# Patient Record
Sex: Male | Born: 1960 | Race: White | Hispanic: No | Marital: Married | Smoking: Current every day smoker
Health system: Southern US, Community
[De-identification: ages and names within clinical notes are randomized; demographics above are authoritative.]

## PROBLEM LIST (undated history)

## (undated) DIAGNOSIS — T300 Burn of unspecified body region, unspecified degree: Secondary | ICD-10-CM

## (undated) DIAGNOSIS — J189 Pneumonia, unspecified organism: Secondary | ICD-10-CM

## (undated) DIAGNOSIS — Z8719 Personal history of other diseases of the digestive system: Secondary | ICD-10-CM

## (undated) DIAGNOSIS — H9190 Unspecified hearing loss, unspecified ear: Secondary | ICD-10-CM

## (undated) DIAGNOSIS — J45909 Unspecified asthma, uncomplicated: Secondary | ICD-10-CM

## (undated) DIAGNOSIS — S83249A Other tear of medial meniscus, current injury, unspecified knee, initial encounter: Secondary | ICD-10-CM

## (undated) DIAGNOSIS — M1990 Unspecified osteoarthritis, unspecified site: Secondary | ICD-10-CM

## (undated) DIAGNOSIS — G709 Myoneural disorder, unspecified: Secondary | ICD-10-CM

## (undated) DIAGNOSIS — S83289A Other tear of lateral meniscus, current injury, unspecified knee, initial encounter: Secondary | ICD-10-CM

## (undated) HISTORY — PX: JOINT REPLACEMENT: SHX530

## (undated) HISTORY — PX: OTHER SURGICAL HISTORY: SHX169

## (undated) HISTORY — PX: TONSILLECTOMY: SUR1361

## (undated) HISTORY — PX: SCROTAL SURGERY: SHX2387

---

## 1999-03-23 ENCOUNTER — Emergency Department (HOSPITAL_COMMUNITY): Admission: EM | Admit: 1999-03-23 | Discharge: 1999-03-23 | Payer: Self-pay | Admitting: Emergency Medicine

## 1999-03-23 ENCOUNTER — Encounter: Payer: Self-pay | Admitting: Emergency Medicine

## 2009-03-07 DIAGNOSIS — J189 Pneumonia, unspecified organism: Secondary | ICD-10-CM

## 2009-03-07 HISTORY — DX: Pneumonia, unspecified organism: J18.9

## 2012-01-17 ENCOUNTER — Other Ambulatory Visit: Payer: Self-pay | Admitting: Occupational Medicine

## 2012-01-17 ENCOUNTER — Ambulatory Visit: Payer: Self-pay

## 2012-01-17 DIAGNOSIS — M25561 Pain in right knee: Secondary | ICD-10-CM

## 2012-03-07 DIAGNOSIS — S83249A Other tear of medial meniscus, current injury, unspecified knee, initial encounter: Secondary | ICD-10-CM

## 2012-03-07 DIAGNOSIS — S83289A Other tear of lateral meniscus, current injury, unspecified knee, initial encounter: Secondary | ICD-10-CM

## 2012-03-07 HISTORY — DX: Other tear of medial meniscus, current injury, unspecified knee, initial encounter: S83.249A

## 2012-03-07 HISTORY — DX: Other tear of lateral meniscus, current injury, unspecified knee, initial encounter: S83.289A

## 2012-03-27 ENCOUNTER — Encounter (HOSPITAL_BASED_OUTPATIENT_CLINIC_OR_DEPARTMENT_OTHER): Payer: Self-pay | Admitting: *Deleted

## 2012-03-27 ENCOUNTER — Other Ambulatory Visit: Payer: Self-pay | Admitting: Orthopedic Surgery

## 2012-03-27 DIAGNOSIS — T300 Burn of unspecified body region, unspecified degree: Secondary | ICD-10-CM

## 2012-03-27 HISTORY — DX: Burn of unspecified body region, unspecified degree: T30.0

## 2012-03-30 ENCOUNTER — Encounter (HOSPITAL_BASED_OUTPATIENT_CLINIC_OR_DEPARTMENT_OTHER)
Admission: RE | Disposition: A | Discharge status: Home / Self Care | Payer: Self-pay | Source: Ambulatory Visit | Attending: Orthopedic Surgery

## 2012-03-30 ENCOUNTER — Ambulatory Visit (HOSPITAL_BASED_OUTPATIENT_CLINIC_OR_DEPARTMENT_OTHER): Payer: Worker's Compensation | Admitting: *Deleted

## 2012-03-30 ENCOUNTER — Encounter (HOSPITAL_BASED_OUTPATIENT_CLINIC_OR_DEPARTMENT_OTHER): Payer: Self-pay | Admitting: *Deleted

## 2012-03-30 ENCOUNTER — Ambulatory Visit (HOSPITAL_BASED_OUTPATIENT_CLINIC_OR_DEPARTMENT_OTHER)
Admission: RE | Admit: 2012-03-30 | Discharge: 2012-03-30 | Disposition: A | Discharge status: Home / Self Care | Payer: Worker's Compensation | Source: Ambulatory Visit | Attending: Orthopedic Surgery | Admitting: Orthopedic Surgery

## 2012-03-30 DIAGNOSIS — M23329 Other meniscus derangements, posterior horn of medial meniscus, unspecified knee: Secondary | ICD-10-CM | POA: Insufficient documentation

## 2012-03-30 DIAGNOSIS — F17200 Nicotine dependence, unspecified, uncomplicated: Secondary | ICD-10-CM | POA: Insufficient documentation

## 2012-03-30 DIAGNOSIS — M23359 Other meniscus derangements, posterior horn of lateral meniscus, unspecified knee: Secondary | ICD-10-CM | POA: Insufficient documentation

## 2012-03-30 DIAGNOSIS — M2240 Chondromalacia patellae, unspecified knee: Secondary | ICD-10-CM | POA: Insufficient documentation

## 2012-03-30 HISTORY — DX: Unspecified hearing loss, unspecified ear: H91.90

## 2012-03-30 HISTORY — DX: Unspecified asthma, uncomplicated: J45.909

## 2012-03-30 HISTORY — PX: KNEE ARTHROSCOPY: SHX127

## 2012-03-30 HISTORY — DX: Other tear of lateral meniscus, current injury, unspecified knee, initial encounter: S83.289A

## 2012-03-30 HISTORY — DX: Other tear of medial meniscus, current injury, unspecified knee, initial encounter: S83.249A

## 2012-03-30 HISTORY — DX: Burn of unspecified body region, unspecified degree: T30.0

## 2012-03-30 SURGERY — ARTHROSCOPY, KNEE
Anesthesia: General | Site: Knee | Laterality: Right | Wound class: Clean

## 2012-03-30 MED ORDER — FENTANYL CITRATE 0.05 MG/ML IJ SOLN
INTRAMUSCULAR | Status: DC | PRN
  Administered 2012-03-30: 50 ug via INTRAVENOUS
  Administered 2012-03-30: 25 ug via INTRAVENOUS
  Administered 2012-03-30: 100 ug via INTRAVENOUS

## 2012-03-30 MED ORDER — DEXAMETHASONE SODIUM PHOSPHATE 4 MG/ML IJ SOLN
INTRAMUSCULAR | Status: DC | PRN
  Administered 2012-03-30: 10 mg via INTRAVENOUS

## 2012-03-30 MED ORDER — BUPIVACAINE HCL (PF) 0.5 % IJ SOLN
INTRAMUSCULAR | Status: DC | PRN
  Administered 2012-03-30: 20 mL

## 2012-03-30 MED ORDER — MIDAZOLAM HCL 2 MG/ML PO SYRP: 12.0000 mg | ORAL_SOLUTION | Freq: Once | ORAL | Status: DC | PRN

## 2012-03-30 MED ORDER — ONDANSETRON HCL 4 MG/2ML IJ SOLN: 4.0000 mg | Freq: Once | INTRAMUSCULAR | Status: DC | PRN

## 2012-03-30 MED ORDER — FENTANYL CITRATE 0.05 MG/ML IJ SOLN: 50.0000 ug | INTRAMUSCULAR | Status: DC | PRN

## 2012-03-30 MED ORDER — OXYCODONE-ACETAMINOPHEN 5-325 MG PO TABS: 1.0000 | ORAL_TABLET | Freq: Four times a day (QID) | ORAL | Status: DC | PRN

## 2012-03-30 MED ORDER — SODIUM CHLORIDE 0.9 % IR SOLN
Status: DC | PRN
  Administered 2012-03-30: 6000 mL

## 2012-03-30 MED ORDER — HYDROMORPHONE HCL PF 1 MG/ML IJ SOLN
0.2500 mg | INTRAMUSCULAR | Status: DC | PRN
  Administered 2012-03-30: 0.5 mg via INTRAVENOUS

## 2012-03-30 MED ORDER — PROPOFOL 10 MG/ML IV BOLUS
INTRAVENOUS | Status: DC | PRN
  Administered 2012-03-30: 300 mg via INTRAVENOUS

## 2012-03-30 MED ORDER — LIDOCAINE HCL (CARDIAC) 20 MG/ML IV SOLN
INTRAVENOUS | Status: DC | PRN
  Administered 2012-03-30: 75 mg via INTRAVENOUS

## 2012-03-30 MED ORDER — OXYCODONE HCL 5 MG PO TABS: 5.0000 mg | ORAL_TABLET | Freq: Once | ORAL | Status: DC | PRN

## 2012-03-30 MED ORDER — LACTATED RINGERS IV SOLN
INTRAVENOUS | Status: DC
  Administered 2012-03-30 (×2): via INTRAVENOUS

## 2012-03-30 MED ORDER — ONDANSETRON HCL 4 MG/2ML IJ SOLN
INTRAMUSCULAR | Status: DC | PRN
  Administered 2012-03-30: 4 mg via INTRAVENOUS

## 2012-03-30 MED ORDER — CEFAZOLIN SODIUM-DEXTROSE 2-3 GM-% IV SOLR
2.0000 g | INTRAVENOUS | Status: AC
  Administered 2012-03-30: 2 g via INTRAVENOUS

## 2012-03-30 MED ORDER — POVIDONE-IODINE 7.5 % EX SOLN: Freq: Once | CUTANEOUS | Status: DC

## 2012-03-30 MED ORDER — MIDAZOLAM HCL 2 MG/2ML IJ SOLN: 1.0000 mg | INTRAMUSCULAR | Status: DC | PRN

## 2012-03-30 MED ORDER — MIDAZOLAM HCL 5 MG/5ML IJ SOLN
INTRAMUSCULAR | Status: DC | PRN
  Administered 2012-03-30: 2 mg via INTRAVENOUS

## 2012-03-30 MED ORDER — OXYCODONE HCL 5 MG/5ML PO SOLN: 5.0000 mg | Freq: Once | ORAL | Status: DC | PRN

## 2012-03-30 SURGICAL SUPPLY — 40 items
BANDAGE ELASTIC 6 VELCRO ST LF (GAUZE/BANDAGES/DRESSINGS) ×2 IMPLANT
BLADE 4.2CUDA (BLADE) IMPLANT
BLADE GREAT WHITE 4.2 (BLADE) ×2 IMPLANT
CANISTER OMNI JUG 16 LITER (MISCELLANEOUS) ×2 IMPLANT
CANISTER SUCTION 2500CC (MISCELLANEOUS) IMPLANT
CLOTH BEACON ORANGE TIMEOUT ST (SAFETY) ×2 IMPLANT
CUTTER MENISCUS  4.2MM (BLADE)
CUTTER MENISCUS 4.2MM (BLADE) IMPLANT
DRAPE ARTHROSCOPY W/POUCH 114 (DRAPES) ×2 IMPLANT
DRSG EMULSION OIL 3X3 NADH (GAUZE/BANDAGES/DRESSINGS) ×2 IMPLANT
DURAPREP 26ML APPLICATOR (WOUND CARE) ×2 IMPLANT
ELECT MENISCUS 165MM 90D (ELECTRODE) IMPLANT
ELECT REM PT RETURN 9FT ADLT (ELECTROSURGICAL)
ELECTRODE REM PT RTRN 9FT ADLT (ELECTROSURGICAL) IMPLANT
GLOVE BIO SURGEON STRL SZ 6.5 (GLOVE) ×2 IMPLANT
GLOVE BIOGEL PI IND STRL 7.0 (GLOVE) ×1 IMPLANT
GLOVE BIOGEL PI IND STRL 8 (GLOVE) ×2 IMPLANT
GLOVE BIOGEL PI INDICATOR 7.0 (GLOVE) ×1
GLOVE BIOGEL PI INDICATOR 8 (GLOVE) ×2
GLOVE ECLIPSE 7.5 STRL STRAW (GLOVE) ×4 IMPLANT
GOWN BRE IMP PREV XXLGXLNG (GOWN DISPOSABLE) ×2 IMPLANT
GOWN PREVENTION PLUS XLARGE (GOWN DISPOSABLE) ×2 IMPLANT
GOWN PREVENTION PLUS XXLARGE (GOWN DISPOSABLE) ×2 IMPLANT
HOLDER KNEE FOAM BLUE (MISCELLANEOUS) ×2 IMPLANT
KNEE WRAP E Z 3 GEL PACK (MISCELLANEOUS) ×2 IMPLANT
NDL SAFETY ECLIPSE 18X1.5 (NEEDLE) IMPLANT
NEEDLE HYPO 18GX1.5 SHARP (NEEDLE)
PACK ARTHROSCOPY DSU (CUSTOM PROCEDURE TRAY) ×2 IMPLANT
PACK BASIN DAY SURGERY FS (CUSTOM PROCEDURE TRAY) ×2 IMPLANT
PAD CAST 4YDX4 CTTN HI CHSV (CAST SUPPLIES) ×1 IMPLANT
PADDING CAST COTTON 4X4 STRL (CAST SUPPLIES) ×1
PENCIL BUTTON HOLSTER BLD 10FT (ELECTRODE) IMPLANT
SET ARTHROSCOPY TUBING (MISCELLANEOUS) ×1
SET ARTHROSCOPY TUBING LN (MISCELLANEOUS) ×1 IMPLANT
SPONGE GAUZE 4X4 12PLY (GAUZE/BANDAGES/DRESSINGS) ×2 IMPLANT
SUT ETHILON 4 0 PS 2 18 (SUTURE) IMPLANT
SYR 5ML LL (SYRINGE) ×2 IMPLANT
TOWEL OR 17X24 6PK STRL BLUE (TOWEL DISPOSABLE) ×2 IMPLANT
TOWEL OR NON WOVEN STRL DISP B (DISPOSABLE) ×2 IMPLANT
WATER STERILE IRR 1000ML POUR (IV SOLUTION) ×2 IMPLANT

## 2012-03-30 NOTE — Brief Op Note (Signed)
03/30/2012  3:58 PM  PATIENT:  Phillip Garrison  52 y.o. 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  PROCEDURE:  Procedure(s) (LRB) with comments: ARTHROSCOPY KNEE (Right) - partial medial and lateral menisectomy and debridement chondroplasty  SURGEON:  Surgeon(s) and Role:    * Harvie Junior, MD - Primary  PHYSICIAN ASSISTANT:   ASSISTANTS: bethune   ANESTHESIA:   general  EBL:  Total I/O In: 2280 [P.O.:480; I.V.:1800] Out: -   BLOOD ADMINISTERED:none  DRAINS: none   LOCAL MEDICATIONS USED:  MARCAINE     SPECIMEN:  No Specimen  DISPOSITION OF SPECIMEN:  N/A  COUNTS:  YES  TOURNIQUET:  * No tourniquets in log *  DICTATION: .Other Dictation: Dictation Number [id number]  PLAN OF CARE: Discharge to home after PACU  PATIENT DISPOSITION:  PACU - hemodynamically stable.   Delay start of Pharmacological VTE agent (>24hrs) due to surgical blood loss or risk of bleeding: no

## 2012-03-30 NOTE — Anesthesia Preprocedure Evaluation (Addendum)
Anesthesia Evaluation  Patient identified by MRN, date of birth, ID band Patient awake    Reviewed: Allergy & Precautions, H&P , NPO status , Patient's Chart, lab work & pertinent test results  Airway Mallampati: II TM Distance: >3 FB Neck ROM: Full    Dental  (+) Missing and Dental Advisory Given,    Pulmonary asthma ,  breath sounds clear to auscultation        Cardiovascular Rhythm:Regular     Neuro/Psych    GI/Hepatic   Endo/Other    Renal/GU      Musculoskeletal   Abdominal   Peds  Hematology   Anesthesia Other Findings On no meds for asthma  Reproductive/Obstetrics                           Anesthesia Physical Anesthesia Plan  ASA: II  Anesthesia Plan: General   Post-op Pain Management:    Induction: Intravenous  Airway Management Planned: LMA  Additional Equipment:   Intra-op Plan:   Post-operative Plan:   Informed Consent: I have reviewed the patients History and Physical, chart, labs and discussed the procedure including the risks, benefits and alternatives for the proposed anesthesia with the patient or authorized representative who has indicated his/her understanding and acceptance.   Dental advisory given  Plan Discussed with: CRNA, Anesthesiologist and Surgeon  Anesthesia Plan Comments:         Anesthesia Quick Evaluation

## 2012-03-30 NOTE — Anesthesia Procedure Notes (Signed)
Procedure Name: LMA Insertion Date/Time: 03/30/2012 1:23 PM Performed by: Zenia Resides D Pre-anesthesia Checklist: Patient identified, Emergency Drugs available, Suction available and Patient being monitored Patient Re-evaluated:Patient Re-evaluated prior to inductionOxygen Delivery Method: Circle System Utilized Preoxygenation: Pre-oxygenation with 100% oxygen Intubation Type: IV induction Ventilation: Mask ventilation without difficulty LMA: LMA inserted LMA Size: 5.0 Number of attempts: 1 Airway Equipment and Method: bite block Placement Confirmation: positive ETCO2 Tube secured with: Tape Dental Injury: Teeth and Oropharynx as per pre-operative assessment

## 2012-03-30 NOTE — Transfer of Care (Signed)
Immediate Anesthesia Transfer of Care Note  Patient: Phillip Garrison  Procedure(s) Performed: Procedure(s) (LRB) with comments: ARTHROSCOPY KNEE (Right) - partial medial and lateral menisectomy and debridement chondroplasty  Patient Location: PACU  Anesthesia Type:General  Level of Consciousness: awake, alert  and oriented  Airway & Oxygen Therapy: Patient Spontanous Breathing and Patient connected to face mask oxygen  Post-op Assessment: Report given to PACU RN and Post -op Vital signs reviewed and stable  Post vital signs: Reviewed and stable  Complications: No apparent anesthesia complications

## 2012-03-30 NOTE — Anesthesia Postprocedure Evaluation (Signed)
  Anesthesia Post-op Note  Patient: Phillip Garrison  Procedure(s) Performed: Procedure(s) (LRB) with comments: ARTHROSCOPY KNEE (Right) - partial medial and lateral menisectomy and debridement chondroplasty  Patient Location: PACU  Anesthesia Type:General  Level of Consciousness: awake, alert  and oriented  Airway and Oxygen Therapy: Patient Spontanous Breathing  Post-op Pain: mild  Post-op Assessment: Post-op Vital signs reviewed  Post-op Vital Signs: Reviewed  Complications: No apparent anesthesia complications
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
## 2012-04-02 ENCOUNTER — Encounter (HOSPITAL_BASED_OUTPATIENT_CLINIC_OR_DEPARTMENT_OTHER): Payer: Self-pay | Admitting: Orthopedic Surgery

## 2012-04-02 NOTE — Op Note (Signed)
NAME:  COREYON, NICOTRA NO.:  [id number]  MEDICAL RECORD NO.:  [id number]  LOCATION:                               FACILITY:  [facility]  PHYSICIAN:  Harvie Junior, M.D.        DATE OF BIRTH:  DATE OF PROCEDURE:  03/30/2012 DATE OF DISCHARGE:  03/30/2012                              OPERATIVE REPORT   PREOPERATIVE DIAGNOSIS:  Both medial and lateral meniscus tear.  POSTOPERATIVE DIAGNOSES: 1. Both medial and lateral meniscus tear. 2. 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 3. Chondromalacia of the patellofemoral joint.  PROCEDURE: 1. A partial medial and partial lateral meniscectomy with     corresponding debridement in the medial and lateral compartments     with grade 4 change medially and grade 2 and 3 change laterally. 2. Chondroplasty of patellofemoral joint down to bleeding bone.  SURGEON:  Harvie Junior, MD  ASSISTANT:  Marshia Ly, PA  ANESTHESIA:  General.  BRIEF HISTORY:  Mr. Klingbeil is a 52 year old male, who was evaluated in the office and noted to have significant right knee pain and effusions.  He had a significant medial joint line tenderness.  MRI was obtained, which showed that he had both medial and lateral meniscus tears and because of failure of all conservative care, an MRI showing this given his young age, we felt that we could help him with a knee arthroscopy and he was brought to the operating room for this procedure.  DESCRIPTION OF PROCEDURE:  The patient was brought to the operating room.  After adequate anesthesia was obtained with general anesthetic, the patient was placed supine on the operating table.  The right leg was prepped and draped in usual sterile fashion.  Following this, routine arthroscopic examination of the knee revealed that there was obvious and severe chondromalacia of the  medial femoral condyle.  This was a striking finding and he had a 2 x 3 cm area of grade 4 change over the medial femoral condyle, a 2 x 2 cm area of grade 4 change over the medial tibial plateau.  This was debrided.  He had a posterior horn medial meniscal tear which was debrided back to a smooth and stable rim. Attention turned to the ACL, normal. Attention turned to the lateral side where there was some grade 2 and 3 change in the lateral femoral condyle and a small posterior horn lateral meniscal tear which was debrided.  Attention was turned back up to the patellofemoral joint where there was some debridement of the chondromalacia patella down to bleeding bone.  Once this was completed, the knee was copiously and thoroughly lavaged and suctioned dry.  The arthroscopic portals were closed with the bandage.  A sterile compressive dressing was applied and the patient was taken to the recovery and was noted to be in satisfactory condition. The estimated blood loss for the procedure was none.     Harvie Junior, M.D.  JLG/MEDQ  D:  03/30/2012  T:  03/31/2012  Job:  [id number]

## 2012-10-04 ENCOUNTER — Other Ambulatory Visit: Payer: Self-pay | Admitting: Orthopedic Surgery

## 2012-10-08 ENCOUNTER — Encounter (HOSPITAL_COMMUNITY): Payer: Self-pay

## 2012-10-12 ENCOUNTER — Encounter (HOSPITAL_COMMUNITY)
Admission: RE | Admit: 2012-10-12 | Discharge: 2012-10-12 | Disposition: A | Discharge status: Home / Self Care | Payer: Worker's Compensation | Source: Ambulatory Visit | Attending: Orthopedic Surgery | Admitting: Orthopedic Surgery

## 2012-10-12 ENCOUNTER — Ambulatory Visit (HOSPITAL_COMMUNITY)
Admission: RE | Admit: 2012-10-12 | Discharge: 2012-10-12 | Disposition: A | Discharge status: Home / Self Care | Payer: Worker's Compensation | Source: Ambulatory Visit | Attending: Orthopedic Surgery | Admitting: Orthopedic Surgery

## 2012-10-12 ENCOUNTER — Encounter (HOSPITAL_COMMUNITY): Payer: Self-pay

## 2012-10-12 DIAGNOSIS — Z01812 Encounter for preprocedural laboratory examination: Secondary | ICD-10-CM | POA: Insufficient documentation

## 2012-10-12 DIAGNOSIS — Z01818 Encounter for other preprocedural examination: Secondary | ICD-10-CM | POA: Insufficient documentation

## 2012-10-12 LAB — CBC WITH DIFFERENTIAL/PLATELET
Basophils Absolute: 0 10*3/uL (ref 0.0–0.1)
Basophils Relative: 0 % (ref 0–1)
Hemoglobin: 16.7 g/dL (ref 13.0–17.0)
MCHC: 36 g/dL (ref 30.0–36.0)
Monocytes Relative: 9 % (ref 3–12)
Neutro Abs: 4.3 10*3/uL (ref 1.7–7.7)
Neutrophils Relative %: 68 % (ref 43–77)
Platelets: 166 10*3/uL (ref 150–400)
RBC: 4.84 MIL/uL (ref 4.22–5.81)

## 2012-10-12 LAB — COMPREHENSIVE METABOLIC PANEL
ALT: 20 U/L (ref 0–53)
AST: 28 U/L (ref 0–37)
Albumin: 3.7 g/dL (ref 3.5–5.2)
Alkaline Phosphatase: 77 U/L (ref 39–117)
BUN: 5 mg/dL — ABNORMAL LOW (ref 6–23)
Chloride: 102 mEq/L (ref 96–112)
Potassium: 4.4 mEq/L (ref 3.5–5.1)
Sodium: 137 mEq/L (ref 135–145)
Total Bilirubin: 0.5 mg/dL (ref 0.3–1.2)
Total Protein: 7.4 g/dL (ref 6.0–8.3)

## 2012-10-12 LAB — URINALYSIS, ROUTINE W REFLEX MICROSCOPIC
Leukocytes, UA: NEGATIVE
Nitrite: NEGATIVE
Specific Gravity, Urine: 1.006 (ref 1.005–1.030)
Urobilinogen, UA: 0.2 mg/dL (ref 0.0–1.0)
pH: 6 (ref 5.0–8.0)

## 2012-10-12 LAB — SURGICAL PCR SCREEN: Staphylococcus aureus: NEGATIVE

## 2012-10-12 LAB — TYPE AND SCREEN: Antibody Screen: NEGATIVE

## 2012-10-12 LAB — APTT: aPTT: 30 seconds (ref 24–37)

## 2012-10-12 NOTE — Pre-Procedure Instructions (Signed)
Phillip Garrison  10/12/2012   Your procedure is scheduled on:  August 18,2014  Report to [facility] at 10:30AM.  Call this number if you have problems the morning of surgery: [phone]   Remember:   Do not eat food or drink liquids after midnight.   Take these medicines the morning of surgery with A SIP OF WATER: none   Do not wear jewelry, make-up or nail polish.  Do not wear lotions, powders, or perfumes. You may wear deodorant.  Do not shave 48 hours prior to surgery. Men may shave face and neck.  Do not bring valuables to the hospital.  [facility] is not responsible                   for any belongings or valuables.  Contacts, dentures or bridgework may not be worn into surgery.  Leave suitcase in the car. After surgery it may be brought to your room.  For patients admitted to the hospital, checkout time is 11:00 AM the day of  discharge.   Patients discharged the day of surgery will not be allowed to drive  home.  Name and phone number of your driver:   Special Instructions: Shower using CHG 2 nights before surgery and the night before surgery.  If you shower the day of surgery use CHG.  Use special wash - you have one bottle of CHG for all showers.  You should use approximately 1/3 of the bottle for each shower.   Please read over the following fact sheets that you were given: Pain Booklet, Coughing and Deep Breathing, Blood Transfusion Information, Total Joint Packet, MRSA Information and Surgical Site Infection Prevention

## 2012-10-15 NOTE — Progress Notes (Signed)
Anesthesia chart review:  Patient is a 52 year old male scheduled for right TKA on 10/22/12 by Dr. Luiz Blare.  History includes smoking, childhood asthma, hard of hearing, wisdom teeth extractions.  He admits to drinking 8-10 beers per day (which was also noted in Dr. Luiz Blare' H&P prior to his knee arthroscopy 03/2012).    Preoperative EKG, CXR, labs noted.   Anticipate that he can proceed as planned.  Could consider placing on benzodiazepine/DT prevention protocol post-operative due to heavy/consistent ETOH use, but would defer decision to his surgeon Dr. Luiz Blare.    Velna Ochs [facility]/Anesthesiology Phone [phone] 10/15/2012 9:52 AM

## 2012-10-19 NOTE — Progress Notes (Signed)
Patient stated he was already aware of time change, and will now arrive at 1200 pm on 10/22/12.

## 2012-10-21 MED ORDER — CEFAZOLIN SODIUM-DEXTROSE 2-3 GM-% IV SOLR: 2.0000 g | INTRAVENOUS | Status: DC

## 2012-10-22 ENCOUNTER — Encounter (HOSPITAL_COMMUNITY): Payer: Self-pay | Admitting: Vascular Surgery

## 2012-10-22 ENCOUNTER — Inpatient Hospital Stay (HOSPITAL_COMMUNITY)
Admission: RE | Admit: 2012-10-22 | Discharge: 2012-10-24 | DRG: 470 | Disposition: A | Discharge status: Home Health | Payer: Worker's Compensation | Source: Ambulatory Visit | Attending: Orthopedic Surgery | Admitting: Orthopedic Surgery

## 2012-10-22 ENCOUNTER — Inpatient Hospital Stay (HOSPITAL_COMMUNITY): Payer: Worker's Compensation | Admitting: Anesthesiology

## 2012-10-22 ENCOUNTER — Encounter (HOSPITAL_COMMUNITY): Payer: Self-pay

## 2012-10-22 ENCOUNTER — Encounter (HOSPITAL_COMMUNITY)
Admission: RE | Disposition: A | Discharge status: Home Health | Payer: Self-pay | Source: Ambulatory Visit | Attending: Orthopedic Surgery

## 2012-10-22 DIAGNOSIS — H9190 Unspecified hearing loss, unspecified ear: Secondary | ICD-10-CM | POA: Diagnosis present

## 2012-10-22 DIAGNOSIS — F17200 Nicotine dependence, unspecified, uncomplicated: Secondary | ICD-10-CM | POA: Diagnosis present

## 2012-10-22 DIAGNOSIS — M1710 Unilateral primary osteoarthritis, unspecified knee: Principal | ICD-10-CM | POA: Diagnosis present

## 2012-10-22 DIAGNOSIS — Z91013 Allergy to seafood: Secondary | ICD-10-CM

## 2012-10-22 DIAGNOSIS — M1711 Unilateral primary osteoarthritis, right knee: Secondary | ICD-10-CM | POA: Diagnosis present

## 2012-10-22 DIAGNOSIS — Z79899 Other long term (current) drug therapy: Secondary | ICD-10-CM

## 2012-10-22 DIAGNOSIS — Z7982 Long term (current) use of aspirin: Secondary | ICD-10-CM

## 2012-10-22 HISTORY — PX: TOTAL KNEE ARTHROPLASTY: SHX125

## 2012-10-22 SURGERY — ARTHROPLASTY, KNEE, TOTAL
Anesthesia: Regional | Site: Knee | Laterality: Right | Wound class: Clean

## 2012-10-22 MED ORDER — SODIUM CHLORIDE 0.9 % IR SOLN
Status: DC | PRN
  Administered 2012-10-22: 3000 mL

## 2012-10-22 MED ORDER — PROMETHAZINE HCL 25 MG/ML IJ SOLN: 12.5000 mg | Freq: Four times a day (QID) | INTRAMUSCULAR | Status: DC | PRN

## 2012-10-22 MED ORDER — BUPIVACAINE-EPINEPHRINE 0.25% -1:200000 IJ SOLN
INTRAMUSCULAR | Status: DC | PRN
  Administered 2012-10-22: 20 mL

## 2012-10-22 MED ORDER — HYDROMORPHONE HCL PF 1 MG/ML IJ SOLN
INTRAMUSCULAR | Status: AC
  Administered 2012-10-22: 0.5 mg via INTRAVENOUS
  Filled 2012-10-22: qty 1

## 2012-10-22 MED ORDER — LIDOCAINE HCL (CARDIAC) 10 MG/ML IV SOLN
INTRAVENOUS | Status: DC | PRN
  Administered 2012-10-22: 50 mg via INTRAVENOUS

## 2012-10-22 MED ORDER — ONDANSETRON HCL 4 MG/2ML IJ SOLN
INTRAMUSCULAR | Status: DC | PRN
  Administered 2012-10-22: 4 mg via INTRAVENOUS

## 2012-10-22 MED ORDER — CELECOXIB 200 MG PO CAPS
200.0000 mg | ORAL_CAPSULE | Freq: Two times a day (BID) | ORAL | Status: DC
  Administered 2012-10-22 – 2012-10-24 (×4): 200 mg via ORAL
  Filled 2012-10-22 (×5): qty 1

## 2012-10-22 MED ORDER — ONDANSETRON HCL 4 MG/2ML IJ SOLN: 4.0000 mg | Freq: Four times a day (QID) | INTRAMUSCULAR | Status: DC | PRN

## 2012-10-22 MED ORDER — OXYCODONE HCL 5 MG PO TABS
5.0000 mg | ORAL_TABLET | Freq: Once | ORAL | Status: AC | PRN
  Administered 2012-10-22: 5 mg via ORAL

## 2012-10-22 MED ORDER — CEFAZOLIN SODIUM-DEXTROSE 2-3 GM-% IV SOLR
2.0000 g | INTRAVENOUS | Status: AC
  Administered 2012-10-22: 2 g via INTRAVENOUS
  Filled 2012-10-22: qty 50

## 2012-10-22 MED ORDER — 0.9 % SODIUM CHLORIDE (POUR BTL) OPTIME
TOPICAL | Status: DC | PRN
  Administered 2012-10-22: 1000 mL

## 2012-10-22 MED ORDER — DIPHENHYDRAMINE HCL 12.5 MG/5ML PO ELIX: 12.5000 mg | ORAL_SOLUTION | ORAL | Status: DC | PRN

## 2012-10-22 MED ORDER — ASPIRIN EC 325 MG PO TBEC
325.0000 mg | DELAYED_RELEASE_TABLET | Freq: Two times a day (BID) | ORAL | Status: DC
  Administered 2012-10-23 – 2012-10-24 (×3): 325 mg via ORAL
  Filled 2012-10-22 (×5): qty 1

## 2012-10-22 MED ORDER — FENTANYL CITRATE 0.05 MG/ML IJ SOLN
INTRAMUSCULAR | Status: DC | PRN
  Administered 2012-10-22: 100 ug via INTRAVENOUS
  Administered 2012-10-22: 50 ug via INTRAVENOUS
  Administered 2012-10-22: 100 ug via INTRAVENOUS
  Administered 2012-10-22 (×3): 50 ug via INTRAVENOUS
  Administered 2012-10-22 (×2): 100 ug via INTRAVENOUS
  Administered 2012-10-22: 50 ug via INTRAVENOUS

## 2012-10-22 MED ORDER — DOCUSATE SODIUM 100 MG PO CAPS
100.0000 mg | ORAL_CAPSULE | Freq: Two times a day (BID) | ORAL | Status: DC
  Administered 2012-10-22 – 2012-10-24 (×4): 100 mg via ORAL
  Filled 2012-10-22 (×5): qty 1

## 2012-10-22 MED ORDER — TRANEXAMIC ACID 100 MG/ML IV SOLN
1000.0000 mg | INTRAVENOUS | Status: AC
  Administered 2012-10-22: 1000 mg via INTRAVENOUS
  Filled 2012-10-22: qty 10

## 2012-10-22 MED ORDER — LACTATED RINGERS IV SOLN
INTRAVENOUS | Status: DC | PRN
  Administered 2012-10-22 (×2): via INTRAVENOUS

## 2012-10-22 MED ORDER — BUPIVACAINE-EPINEPHRINE PF 0.5-1:200000 % IJ SOLN
INTRAMUSCULAR | Status: DC | PRN
  Administered 2012-10-22: 30 mL

## 2012-10-22 MED ORDER — MIDAZOLAM HCL 5 MG/ML IJ SOLN
2.0000 mg | Freq: Once | INTRAMUSCULAR | Status: AC
  Administered 2012-10-22: 2 mg via INTRAVENOUS

## 2012-10-22 MED ORDER — ZOLPIDEM TARTRATE 5 MG PO TABS: 5.0000 mg | ORAL_TABLET | Freq: Every evening | ORAL | Status: DC | PRN

## 2012-10-22 MED ORDER — PROPOFOL 10 MG/ML IV BOLUS
INTRAVENOUS | Status: DC | PRN
  Administered 2012-10-22: 200 mg via INTRAVENOUS

## 2012-10-22 MED ORDER — CEFUROXIME SODIUM 1.5 G IJ SOLR
INTRAMUSCULAR | Status: AC
  Filled 2012-10-22: qty 1.5

## 2012-10-22 MED ORDER — HYDROMORPHONE HCL PF 1 MG/ML IJ SOLN
1.0000 mg | INTRAMUSCULAR | Status: DC | PRN
  Administered 2012-10-22 – 2012-10-23 (×2): 2 mg via INTRAVENOUS
  Filled 2012-10-22 (×2): qty 2

## 2012-10-22 MED ORDER — ALUM & MAG HYDROXIDE-SIMETH 200-200-20 MG/5ML PO SUSP: 30.0000 mL | ORAL | Status: DC | PRN

## 2012-10-22 MED ORDER — METHOCARBAMOL 100 MG/ML IJ SOLN
500.0000 mg | Freq: Four times a day (QID) | INTRAVENOUS | Status: DC | PRN
  Filled 2012-10-22: qty 5

## 2012-10-22 MED ORDER — SODIUM CHLORIDE 0.9 % IV SOLN: 10.0000 mg | Freq: Once | INTRAVENOUS | Status: DC

## 2012-10-22 MED ORDER — OXYCODONE-ACETAMINOPHEN 5-325 MG PO TABS
1.0000 | ORAL_TABLET | ORAL | Status: DC | PRN
  Administered 2012-10-23 – 2012-10-24 (×6): 2 via ORAL
  Filled 2012-10-22 (×6): qty 2

## 2012-10-22 MED ORDER — BUPIVACAINE-EPINEPHRINE PF 0.25-1:200000 % IJ SOLN
INTRAMUSCULAR | Status: AC
  Filled 2012-10-22: qty 30

## 2012-10-22 MED ORDER — HYDROMORPHONE HCL PF 1 MG/ML IJ SOLN
0.2500 mg | INTRAMUSCULAR | Status: DC | PRN
  Administered 2012-10-22 (×2): 0.5 mg via INTRAVENOUS

## 2012-10-22 MED ORDER — METHOCARBAMOL 500 MG PO TABS
ORAL_TABLET | ORAL | Status: AC
  Filled 2012-10-22: qty 1

## 2012-10-22 MED ORDER — DEXAMETHASONE SODIUM PHOSPHATE 10 MG/ML IJ SOLN
10.0000 mg | Freq: Three times a day (TID) | INTRAMUSCULAR | Status: AC
  Filled 2012-10-22 (×3): qty 1

## 2012-10-22 MED ORDER — POVIDONE-IODINE 7.5 % EX SOLN
Freq: Once | CUTANEOUS | Status: DC
  Filled 2012-10-22: qty 118

## 2012-10-22 MED ORDER — METHOCARBAMOL 500 MG PO TABS
500.0000 mg | ORAL_TABLET | Freq: Four times a day (QID) | ORAL | Status: DC | PRN
  Administered 2012-10-22 – 2012-10-23 (×2): 500 mg via ORAL
  Filled 2012-10-22: qty 1

## 2012-10-22 MED ORDER — MIDAZOLAM HCL 2 MG/2ML IJ SOLN
INTRAMUSCULAR | Status: AC
  Administered 2012-10-22: 2 mg
  Filled 2012-10-22: qty 2

## 2012-10-22 MED ORDER — FERROUS SULFATE 325 (65 FE) MG PO TABS
325.0000 mg | ORAL_TABLET | Freq: Two times a day (BID) | ORAL | Status: DC
  Administered 2012-10-23 – 2012-10-24 (×3): 325 mg via ORAL
  Filled 2012-10-22 (×5): qty 1

## 2012-10-22 MED ORDER — OXYCODONE HCL 5 MG PO TABS
ORAL_TABLET | ORAL | Status: AC
  Filled 2012-10-22: qty 1

## 2012-10-22 MED ORDER — CEFAZOLIN SODIUM-DEXTROSE 2-3 GM-% IV SOLR
2.0000 g | Freq: Four times a day (QID) | INTRAVENOUS | Status: AC
  Administered 2012-10-22 – 2012-10-23 (×2): 2 g via INTRAVENOUS
  Filled 2012-10-22 (×2): qty 50

## 2012-10-22 MED ORDER — DEXTROSE-NACL 5-0.45 % IV SOLN
INTRAVENOUS | Status: DC
  Administered 2012-10-22: 21:00:00 via INTRAVENOUS

## 2012-10-22 MED ORDER — FENTANYL CITRATE 0.05 MG/ML IJ SOLN
INTRAMUSCULAR | Status: AC
  Administered 2012-10-22: 100 ug via INTRAVENOUS
  Filled 2012-10-22: qty 2

## 2012-10-22 MED ORDER — POLYETHYLENE GLYCOL 3350 17 G PO PACK: 17.0000 g | PACK | Freq: Every day | ORAL | Status: DC | PRN

## 2012-10-22 MED ORDER — ACETAMINOPHEN 325 MG PO TABS: 650.0000 mg | ORAL_TABLET | Freq: Four times a day (QID) | ORAL | Status: DC | PRN

## 2012-10-22 MED ORDER — ACETAMINOPHEN 650 MG RE SUPP: 650.0000 mg | Freq: Four times a day (QID) | RECTAL | Status: DC | PRN

## 2012-10-22 MED ORDER — CEFUROXIME SODIUM 1.5 G IJ SOLR
INTRAMUSCULAR | Status: DC | PRN
  Administered 2012-10-22: 1.5 g

## 2012-10-22 MED ORDER — DEXAMETHASONE 4 MG PO TABS
10.0000 mg | ORAL_TABLET | Freq: Three times a day (TID) | ORAL | Status: AC
  Administered 2012-10-22 – 2012-10-23 (×3): 10 mg via ORAL
  Filled 2012-10-22 (×3): qty 2

## 2012-10-22 MED ORDER — OXYCODONE HCL 5 MG/5ML PO SOLN: 5.0000 mg | Freq: Once | ORAL | Status: AC | PRN

## 2012-10-22 MED ORDER — FENTANYL CITRATE 0.05 MG/ML IJ SOLN: 100.0000 ug | Freq: Once | INTRAMUSCULAR | Status: AC

## 2012-10-22 MED ORDER — ONDANSETRON HCL 4 MG PO TABS: 4.0000 mg | ORAL_TABLET | Freq: Four times a day (QID) | ORAL | Status: DC | PRN

## 2012-10-22 MED ORDER — DEXAMETHASONE SODIUM PHOSPHATE 4 MG/ML IJ SOLN
INTRAMUSCULAR | Status: DC | PRN
  Administered 2012-10-22: 10 mg via INTRAVENOUS

## 2012-10-22 SURGICAL SUPPLY — 67 items
BANDAGE ESMARK 6X9 LF (GAUZE/BANDAGES/DRESSINGS) ×1 IMPLANT
BENZOIN TINCTURE PRP APPL 2/3 (GAUZE/BANDAGES/DRESSINGS) ×2 IMPLANT
BLADE SAGITTAL 25.0X1.19X90 (BLADE) ×2 IMPLANT
BLADE SAW SAG 90X13X1.27 (BLADE) ×2 IMPLANT
BNDG ESMARK 6X9 LF (GAUZE/BANDAGES/DRESSINGS) ×2
BOWL SMART MIX CTS (DISPOSABLE) ×2 IMPLANT
CAPT RP KNEE ×2 IMPLANT
CEMENT HV SMART SET (Cement) ×4 IMPLANT
CLOTH BEACON ORANGE TIMEOUT ST (SAFETY) ×2 IMPLANT
COVER SURGICAL LIGHT HANDLE (MISCELLANEOUS) ×2 IMPLANT
CUFF TOURNIQUET SINGLE 34IN LL (TOURNIQUET CUFF) ×2 IMPLANT
CUFF TOURNIQUET SINGLE 44IN (TOURNIQUET CUFF) IMPLANT
DRAPE EXTREMITY T 121X128X90 (DRAPE) ×2 IMPLANT
DRAPE PROXIMA HALF (DRAPES) ×2 IMPLANT
DRAPE U-SHAPE 47X51 STRL (DRAPES) ×2 IMPLANT
DRSG ADAPTIC 3X8 NADH LF (GAUZE/BANDAGES/DRESSINGS) ×2 IMPLANT
DRSG PAD ABDOMINAL 8X10 ST (GAUZE/BANDAGES/DRESSINGS) ×2 IMPLANT
DURAPREP 26ML APPLICATOR (WOUND CARE) ×2 IMPLANT
ELECT REM PT RETURN 9FT ADLT (ELECTROSURGICAL) ×2
ELECTRODE REM PT RTRN 9FT ADLT (ELECTROSURGICAL) ×1 IMPLANT
EVACUATOR 1/8 PVC DRAIN (DRAIN) ×2 IMPLANT
FACESHIELD LNG OPTICON STERILE (SAFETY) ×2 IMPLANT
GAUZE XEROFORM 5X9 LF (GAUZE/BANDAGES/DRESSINGS) ×2 IMPLANT
GLOVE BIO SURGEON STRL SZ 6.5 (GLOVE) ×6 IMPLANT
GLOVE BIOGEL PI IND STRL 6.5 (GLOVE) ×3 IMPLANT
GLOVE BIOGEL PI IND STRL 8 (GLOVE) ×4 IMPLANT
GLOVE BIOGEL PI INDICATOR 6.5 (GLOVE) ×3
GLOVE BIOGEL PI INDICATOR 8 (GLOVE) ×4
GLOVE ECLIPSE 7.5 STRL STRAW (GLOVE) ×4 IMPLANT
GOWN PREVENTION PLUS LG XLONG (DISPOSABLE) ×2 IMPLANT
GOWN STRL NON-REIN LRG LVL3 (GOWN DISPOSABLE) ×2 IMPLANT
GOWN STRL REIN XL XLG (GOWN DISPOSABLE) ×4 IMPLANT
HANDPIECE INTERPULSE COAX TIP (DISPOSABLE) ×1
HOOD PEEL AWAY FACE SHEILD DIS (HOOD) ×6 IMPLANT
IMMOBILIZER KNEE 20 (SOFTGOODS)
IMMOBILIZER KNEE 20 THIGH 36 (SOFTGOODS) IMPLANT
IMMOBILIZER KNEE 22 UNIV (SOFTGOODS) ×2 IMPLANT
IMMOBILIZER KNEE 24 THIGH 36 (MISCELLANEOUS) IMPLANT
IMMOBILIZER KNEE 24 UNIV (MISCELLANEOUS)
KIT BASIN OR (CUSTOM PROCEDURE TRAY) ×2 IMPLANT
KIT ROOM TURNOVER OR (KITS) ×2 IMPLANT
MANIFOLD NEPTUNE II (INSTRUMENTS) ×2 IMPLANT
NEEDLE 22X1 1/2 (OR ONLY) (NEEDLE) ×2 IMPLANT
NEEDLE HYPO 25GX1X1/2 BEV (NEEDLE) IMPLANT
NS IRRIG 1000ML POUR BTL (IV SOLUTION) ×2 IMPLANT
PACK TOTAL JOINT (CUSTOM PROCEDURE TRAY) ×2 IMPLANT
PAD ARMBOARD 7.5X6 YLW CONV (MISCELLANEOUS) ×4 IMPLANT
PAD CAST 4YDX4 CTTN HI CHSV (CAST SUPPLIES) ×1 IMPLANT
PADDING CAST ABS 6INX4YD NS (CAST SUPPLIES) ×1
PADDING CAST ABS COTTON 6X4 NS (CAST SUPPLIES) ×1 IMPLANT
PADDING CAST COTTON 4X4 STRL (CAST SUPPLIES) ×1
SET HNDPC FAN SPRY TIP SCT (DISPOSABLE) ×1 IMPLANT
SPONGE GAUZE 4X4 12PLY (GAUZE/BANDAGES/DRESSINGS) ×4 IMPLANT
STAPLER VISISTAT 35W (STAPLE) IMPLANT
STRIP CLOSURE SKIN 1/2X4 (GAUZE/BANDAGES/DRESSINGS) ×2 IMPLANT
SUCTION FRAZIER TIP 10 FR DISP (SUCTIONS) ×2 IMPLANT
SUT MON AB 3-0 SH 27 (SUTURE)
SUT MON AB 3-0 SH27 (SUTURE) IMPLANT
SUT VIC AB 0 CTB1 27 (SUTURE) ×4 IMPLANT
SUT VIC AB 1 CT1 27 (SUTURE) ×2
SUT VIC AB 1 CT1 27XBRD ANBCTR (SUTURE) ×2 IMPLANT
SUT VIC AB 2-0 CTB1 (SUTURE) ×4 IMPLANT
SYR CONTROL 10ML LL (SYRINGE) IMPLANT
TOWEL OR 17X24 6PK STRL BLUE (TOWEL DISPOSABLE) ×2 IMPLANT
TOWEL OR 17X26 10 PK STRL BLUE (TOWEL DISPOSABLE) ×2 IMPLANT
TRAY FOLEY CATH 16FRSI W/METER (SET/KITS/TRAYS/PACK) ×2 IMPLANT
WATER STERILE IRR 1000ML POUR (IV SOLUTION) ×4 IMPLANT

## 2012-10-22 NOTE — Transfer of Care (Signed)
Immediate Anesthesia Transfer of Care Note  Patient: Phillip Garrison  Procedure(s) Performed: Procedure(s): TOTAL KNEE ARTHROPLASTY (Right)  Patient Location: PACU  Anesthesia Type:General  Level of Consciousness: awake, alert  and oriented  Airway & Oxygen Therapy: Patient connected to face mask oxygen  Post-op Assessment: Report given to PACU RN  Post vital signs: stable  Complications: No apparent anesthesia complications

## 2012-10-22 NOTE — Progress Notes (Signed)
Orthopedic Tech Progress Note Patient Details:  Phillip Garrison Oct 12, 1960 [id number]  CPM Right Knee CPM Right Knee: On Right Knee Flexion (Degrees): 60 Right Knee Extension (Degrees): 0   Jennye Moccasin 10/22/2012, 6:10 PM

## 2012-10-22 NOTE — OR Nursing (Signed)
Pre-op Pt stated that he had no sensitivity to topical betadine.

## 2012-10-22 NOTE — Op Note (Signed)
NAME:  Phillip Garrison, BUCKLE NO.:  [id number]  MEDICAL RECORD NO.:  [id number]  LOCATION:  [id number]                        FACILITY:  [facility]  PHYSICIAN:  Harvie Junior, M.D.   DATE OF BIRTH:  10-10-1960  DATE OF PROCEDURE:  10/22/2012 DATE OF DISCHARGE:                              OPERATIVE REPORT   PREOPERATIVE DIAGNOSIS:  End-stage degenerative joint disease, right knee.  POSTOPERATIVE DIAGNOSIS:  End-stage degenerative joint disease, right knee.  PRINCIPAL PROCEDURE: 1. Right total knee replacement with a Sigma system, size 5 femur,     size 5 tibia, 15 mm bridging bearing, and a 38 mm all polyethylene     patella. 2. Extensive synovectomy, right knee.  SURGEON:  Harvie Junior, M.D.  ASSISTANT:  Marshia Ly, PA-C  ANESTHESIA:  General.  BRIEF HISTORY:  Ms. Maler is a 52 year old male with a long history of having significant degenerative complaints of the right knee.  He had been treated conservatively for a long period of time with arthroscopy, aspiration, and injection therapy, and after failure of all conservative care, and arthroscopic pictures showing severe extensive bone-on-bone changes, he was taken to the operating room for right total knee replacement.  The patient is having night pain and light activity pain. He was brought to the operating room for this procedure.  PROCEDURE:  The patient was brought to the operating room.  After adequate anesthesia was obtained with general anesthetic, the patient was brought to the operating room table.  The right leg was prepped and draped in usual sterile fashion.  Following this, the leg was exsanguinated to 350 mmHg and following this, a midline incision was made in the subcutaneous tissue and dissected down to the level of the extensor mechanism and a medial parapatellar arthrotomy was undertaken. Following this, a tremendous amount of joint fluid was encountered and the patient had a tremendous amount  of a proliferative synovial disease process.  At this point, we did a thorough synovectomy taking probably a cup of synovial tissue.  This attention was then turned back to the knee, where the medial and lateral meniscus removed, retropatellar fat pad, synovium on the anterior aspect of the femur and the anterior and posterior cruciates.  Once this was done, the knee was put into flexion and the tibia was then cut perpendicular to its long axis basically just at the low point on the low side.  Following this, a drill was used to make a pilot hole into the femur and the femur was then cut.  A 10 mm of distal femur was then cut at this point and a spacer block was put in place as a 12.5 fit nicely at this point.  Attention was then turned towards sizing the femur, it was sized to a 5 and we made anterior and posterior cuts, chamfers and box.  Attention was then turned to the tibia, sized to a 5.  It was drilled and keeled.  Attention was turned to the patella, cut down to a level 13 mm.  A 38 paddle was chosen and lugs were drilled and lugs were drilled for the femur.  Trial poly was put in the place  of the patella and the knee put through a range of motion.  Excellent stability was achieved.  A flexion gap was a little bit loose tissue at this point.  Extension gap was fine and could certainly did not go into hyperextension.  At this point, the trial components were removed.  The knee was copiously and thoroughly lavaged and suctioned dry.  The final components were then cemented into place, size 5 femur, size 5 tibia, 12.5 mm bridging bearing trial was placed and a 38 mm all polyethylene patella was placed.  Following this, attention was turned towards allowing the cement to harden.  All excess bone cement was removed.  Once this was completed, attention was turned towards trialing with a 12.5 poly to get full extension.  Flexion was still a little bit loose.  I was concerned about that  and so we went with a trial of 15 poly.  I did leave about a 3 or 4 degree extension contracture, but easy full flexion and had a better feel at 90 degrees. At that point, we felt that this was the appropriate poly to go with and the knee was again irrigated and suctioned dry.  The tourniquet was let down.  All bleeding was controlled with electrocautery and a final poly was put into place with a size 15.  At this point, the knee was put through a range of motion and felt to be stable in all directions.  The medial parapatellar arthrotomy was closed with a 1 Vicryl running, the skin with 0 and 2-0 Vicryl and 3-0 Monocryl subcuticular.  Benzoin and Steri-Strips applied.  Sterile compressive dressing was applied.  The patient was taken to the recovery room and was noted to be in a satisfactory condition.  Estimated blood loss for the procedure was less than 50 mL.     Harvie Junior, M.D.     Ranae Plumber  D:  10/22/2012  T:  10/22/2012  Job:  [id number]

## 2012-10-22 NOTE — Anesthesia Preprocedure Evaluation (Addendum)
Anesthesia Evaluation  Patient identified by MRN, date of birth, ID band Patient awake    Reviewed: Allergy & Precautions, H&P , NPO status , Patient's Chart, lab work & pertinent test results  Airway Mallampati: II TM Distance: >3 FB Neck ROM: Full    Dental no notable dental hx. (+) Teeth Intact and Dental Advisory Given   Pulmonary asthma , Current Smoker,  breath sounds clear to auscultation  Pulmonary exam normal       Cardiovascular negative cardio ROS  Rhythm:Regular Rate:Normal     Neuro/Psych negative neurological ROS  negative psych ROS   GI/Hepatic negative GI ROS, Neg liver ROS,   Endo/Other  negative endocrine ROS  Renal/GU negative Renal ROS  negative genitourinary   Musculoskeletal   Abdominal   Peds  Hematology negative hematology ROS (+)   Anesthesia Other Findings   Reproductive/Obstetrics negative OB ROS                          Anesthesia Physical Anesthesia Plan  ASA: II  Anesthesia Plan: General and Regional   Post-op Pain Management:    Induction: Intravenous  Airway Management Planned: LMA  Additional Equipment:   Intra-op Plan:   Post-operative Plan: Extubation in OR  Informed Consent: I have reviewed the patients History and Physical, chart, labs and discussed the procedure including the risks, benefits and alternatives for the proposed anesthesia with the patient or authorized representative who has indicated his/her understanding and acceptance.   Dental advisory given  Plan Discussed with: CRNA  Anesthesia Plan Comments:         Anesthesia Quick Evaluation

## 2012-10-22 NOTE — Anesthesia Procedure Notes (Signed)
Anesthesia Regional Block:  Femoral nerve block  Pre-Anesthetic Checklist: ,, timeout performed, Correct Patient, Correct Site, Correct Laterality, Correct Procedure, Correct Position, site marked, Risks and benefits discussed, pre-op evaluation,  At surgeon's request and post-op pain management  Laterality: Right  Prep: Maximum Sterile Barrier Precautions used and chloraprep       Needles:  Injection technique: Single-shot  Needle Type: Echogenic Stimulator Needle      Needle Gauge: 22 and 22 G    Additional Needles:  Procedures: ultrasound guided (picture in chart) Femoral nerve block  Nerve Stimulator or Paresthesia:  Response: Patellar respose,   Additional Responses:   Narrative:  Start time: 10/22/2012 1:00 PM End time: 10/22/2012 1:07 PM Injection made incrementally with aspirations every 5 mL. Anesthesiologist: Wyona Neils,MD  Additional Notes: 2% Lidocaine skin wheel.   Femoral nerve block

## 2012-10-22 NOTE — Brief Op Note (Signed)
10/22/2012  4:51 PM  PATIENT:  Caren Hazy  52 y.o. male  PRE-OPERATIVE DIAGNOSIS:  degenerative joint disease  POST-OPERATIVE DIAGNOSIS:  degenerative joint disease  PROCEDURE:  Procedure(s): TOTAL KNEE ARTHROPLASTY (Right)  SURGEON:  Surgeon(s) and Role:    * Harvie Junior, MD - Primary  PHYSICIAN ASSISTANT:   ASSISTANTS: bethune   ANESTHESIA:   general  EBL:  Total I/O In: 1000 [I.V.:1000] Out: 300 [Urine:300]  BLOOD ADMINISTERED:none  DRAINS: (1) Hemovact drain(s) in the r knee with  Suction Open   LOCAL MEDICATIONS USED:  NONE  SPECIMEN:  No Specimen  DISPOSITION OF SPECIMEN:  N/A  COUNTS:  YES  TOURNIQUET:   Total Tourniquet Time Documented: Thigh (Right) - 63 minutes Total: Thigh (Right) - 63 minutes   DICTATION: .Other Dictation: Dictation Number [id number]  PLAN OF CARE: Admit to inpatient   PATIENT DISPOSITION:  PACU - hemodynamically stable.   Delay start of Pharmacological VTE agent (>24hrs) due to surgical blood loss or risk of bleeding: no
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
## 2012-10-22 NOTE — Preoperative (Signed)
Beta Blockers   Reason not to administer Beta Blockers:Not Applicable 

## 2012-10-23 ENCOUNTER — Encounter (HOSPITAL_COMMUNITY): Payer: Self-pay | Admitting: Orthopedic Surgery

## 2012-10-23 LAB — BASIC METABOLIC PANEL
BUN: 6 mg/dL (ref 6–23)
CO2: 28 mEq/L (ref 19–32)
Calcium: 8.9 mg/dL (ref 8.4–10.5)
Glucose, Bld: 165 mg/dL — ABNORMAL HIGH (ref 70–99)
Sodium: 136 mEq/L (ref 135–145)

## 2012-10-23 LAB — CBC
HCT: 36.7 % — ABNORMAL LOW (ref 39.0–52.0)
Hemoglobin: 12.5 g/dL — ABNORMAL LOW (ref 13.0–17.0)
MCH: 33.1 pg (ref 26.0–34.0)
MCV: 97.1 fL (ref 78.0–100.0)
RBC: 3.78 MIL/uL — ABNORMAL LOW (ref 4.22–5.81)

## 2012-10-23 NOTE — Anesthesia Postprocedure Evaluation (Signed)
  Anesthesia Post-op Note  Patient: Caren Hazy  Procedure(s) Performed: Procedure(s): TOTAL KNEE ARTHROPLASTY (Right)  Patient Location: PACU  Anesthesia Type:GA combined with regional for post-op pain  Level of Consciousness: awake and alert   Airway and Oxygen Therapy: Patient Spontanous Breathing and Patient connected to nasal cannula oxygen  Post-op Pain: moderate  Post-op Assessment: Post-op Vital signs reviewed, Patient's Cardiovascular Status Stable, Respiratory Function Stable, Patent Airway and No signs of Nausea or vomiting  Post-op Vital Signs: Reviewed and stable  Complications: No apparent anesthesia complications

## 2012-10-23 NOTE — Progress Notes (Signed)
Workers Comp Case Manager Melody Demetria Pore [phone]  10/23/12 Spoke with patient, received name of and permission to contact workers comp CM. Contacted Ms Demetria Pore, informed her patient would need HHPT with anticipated discharge 10/24/12, HHPT start date 10/25/12. She stated that she had contact [facility] to set up HHPT. She will verify with [facility] that HHPT is set up. I will follow up with her on 10/24/12. Patient has already received CPM, rolling walker and 3N1 from [employer]. Jacquelynn Cree RN, BSN, CCM

## 2012-10-23 NOTE — Progress Notes (Signed)
Subjective: 1 Day Post-Op Procedure(s) (LRB): TOTAL KNEE ARTHROPLASTY (Right) Patient reports pain as 3 on 0-10 scale.   Taking by mouth okay.  Foley out this a.m. Objective: Vital signs in last 24 hours: Temp:  [97.1 F (36.2 C)-99 F (37.2 C)] 99 F (37.2 C) (08/19 0612) Pulse Rate:  [68-82] 75 (08/19 0612) Resp:  [9-21] 16 (08/19 0612) BP: (104-177)/(66-98) 128/74 mmHg (08/19 0612) SpO2:  [91 %-99 %] 97 % (08/19 0612) Weight:  [100.699 kg (222 lb)] 100.699 kg (222 lb) (08/18 2100)  Intake/Output from previous day: 08/18 0701 - 08/19 0700 In: 1000 [I.V.:1000] Out: 2900 [Urine:2050; Drains:850] Intake/Output this shift:     Recent Labs  10/23/12 0430  HGB 12.5*    Recent Labs  10/23/12 0430  WBC 10.6*  RBC 3.78*  HCT 36.7*  PLT 163    Recent Labs  10/23/12 0430  NA 136  K 4.5  CL 102  CO2 28  BUN 6  CREATININE 0.64  GLUCOSE 165*  CALCIUM 8.9   Right knee exam: Neurovascular intact Sensation intact distally Intact pulses distally Dorsiflexion/Plantar flexion intact Incision: dressing C/D/I Compartment soft  Assessment/Plan: 1 Day Post-Op Procedure(s) (LRB): TOTAL KNEE ARTHROPLASTY (Right) Plan: Drain  pulled Plan for discharge tomorrow Mobilize patient with physical therapy. Aspirin 325 mg twice daily for DVT prophylaxis.  Dustina Scoggin G 10/23/2012, 9:52 AM

## 2012-10-23 NOTE — Progress Notes (Signed)
Utilization review complete. Tinika Bucknam RN CCM Case Mgmt phone [phone] 
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
## 2012-10-24 LAB — CBC
MCH: 33.7 pg (ref 26.0–34.0)
MCV: 96.7 fL (ref 78.0–100.0)
Platelets: 139 10*3/uL — ABNORMAL LOW (ref 150–400)
RBC: 3.06 MIL/uL — ABNORMAL LOW (ref 4.22–5.81)

## 2012-10-24 MED ORDER — OXYCODONE-ACETAMINOPHEN 5-325 MG PO TABS: 1.0000 | ORAL_TABLET | Freq: Four times a day (QID) | ORAL | Status: DC | PRN

## 2012-10-24 MED ORDER — ASPIRIN 325 MG PO TBEC: 325.0000 mg | DELAYED_RELEASE_TABLET | Freq: Two times a day (BID) | ORAL | Status: DC

## 2012-10-24 MED ORDER — OXYCODONE-ACETAMINOPHEN 5-325 MG PO TABS: 1.0000 | ORAL_TABLET | ORAL | Status: DC | PRN

## 2012-10-24 MED ORDER — METHOCARBAMOL 750 MG PO TABS: 750.0000 mg | ORAL_TABLET | Freq: Three times a day (TID) | ORAL | Status: DC

## 2012-10-24 NOTE — Progress Notes (Signed)
Subjective: 2 Days Post-Op Procedure(s) (LRB): TOTAL KNEE ARTHROPLASTY (Right) Patient reports pain as 7 on 0-10 scale.   Pt slipped out of bed onto floor this am.  Objective: Vital signs in last 24 hours: Temp:  [97.5 F (36.4 C)-98.7 F (37.1 C)] 97.5 F (36.4 C) (08/20 0715) Pulse Rate:  [66-89] 89 (08/20 0715) Resp:  [16-18] 18 (08/20 0715) BP: (139-159)/(82-90) 159/90 mmHg (08/20 0715) SpO2:  [95 %-98 %] 98 % (08/20 0715)  Intake/Output from previous day: 08/19 0701 - 08/20 0700 In: 480 [P.O.:480] Out: -  Intake/Output this shift:     Recent Labs  10/23/12 0430 10/24/12 0553  HGB 12.5* 10.3*    Recent Labs  10/23/12 0430 10/24/12 0553  WBC 10.6* 10.4  RBC 3.78* 3.06*  HCT 36.7* 29.6*  PLT 163 139*    Recent Labs  10/23/12 0430  NA 136  K 4.5  CL 102  CO2 28  BUN 6  CREATININE 0.64  GLUCOSE 165*  CALCIUM 8.9   Right knee: Neurovascular intact Sensation intact distally Intact pulses distally Dorsiflexion/Plantar flexion intact Incision: dressing C/D/I Compartment soft Able to do SLR.  +1 effusion. Assessment/Plan: 2 Days Post-Op Procedure(s) (LRB): TOTAL KNEE ARTHROPLASTY (Right) Plan:  Dressing changed. Discharge home with home health F/U Dr Luiz Blare in 2 weeks.  Phillip Garrison 10/24/2012, 8:25 AM

## 2012-10-24 NOTE — Progress Notes (Addendum)
Patient's bed alarm going off around 0715, and nursing responded, found patient sitting on edge of bed, said he fell out of bed rolling over to sit up for breakfast.  Said when he got up he slipped and landed and went down on right knee. Patient states he did not hit his head.  Vitals signs taken, 97.5-159/90-89-18-98% on room air. Physical assessment completed. No bruises or breaks in skin noted.  Pain at an 8 in the operative knee, no complaints of pain anywhere else.  Red socks were in place.  Patient had small amount of blood on dressing of operative knee.  Additional gauze applied to help minimize bleeding.  Ice was subsequently applied as well.  Patient last went to bathroom around 2345, said he did not need to go to the bathroom around the time of the fall.  Rounding completed for the 0600 hour and patient was in bed sleeping, with bed alarm on and call bell in place.  Fall huddle and SZP completed.  Patient notified his wife Steward Drone, he did not seem up by fall.  Spoke with Gus Puma, PA for Dr. Luiz Blare, at [phone] this am, no new orders at this time, he will be up to see patient shortly.
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
## 2012-10-24 NOTE — Progress Notes (Signed)
10/24/12 Call to Ms Demetria Pore, she is out of office today, told to call [facility] [phone]. Spoke with Lenise Arena at [facility], told that patient has been set up with [facility]. Faxed HHPT order, H and P, Op note, and last progress note to Mr Jean Rosenthal at [phone]. Informed the patient that [facility] will be with [facility] and gave him tel # for local office in [place]. Jacquelynn Cree RN, BSN, CCM

## 2012-10-24 NOTE — Progress Notes (Signed)
Gus Puma PA up to see patient this am post fall. Dressing changed to 4x4's, ABD and ace wrap. Patient to be d/c today. Nursing will continue to monitor.
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
## 2014-08-19 ENCOUNTER — Other Ambulatory Visit: Payer: Self-pay | Admitting: Orthopedic Surgery

## 2014-09-10 ENCOUNTER — Ambulatory Visit (HOSPITAL_COMMUNITY)
Admission: RE | Admit: 2014-09-10 | Discharge: 2014-09-10 | Disposition: A | Discharge status: Home / Self Care | Payer: Self-pay | Source: Ambulatory Visit | Attending: Orthopedic Surgery | Admitting: Orthopedic Surgery

## 2014-09-10 ENCOUNTER — Encounter (HOSPITAL_COMMUNITY): Payer: Self-pay

## 2014-09-10 ENCOUNTER — Encounter (HOSPITAL_COMMUNITY)
Admission: RE | Admit: 2014-09-10 | Discharge: 2014-09-10 | Disposition: A | Discharge status: Home / Self Care | Payer: Worker's Compensation | Source: Ambulatory Visit | Attending: Orthopedic Surgery | Admitting: Orthopedic Surgery

## 2014-09-10 DIAGNOSIS — Z01818 Encounter for other preprocedural examination: Secondary | ICD-10-CM | POA: Insufficient documentation

## 2014-09-10 DIAGNOSIS — Z01810 Encounter for preprocedural cardiovascular examination: Secondary | ICD-10-CM | POA: Insufficient documentation

## 2014-09-10 DIAGNOSIS — Z01812 Encounter for preprocedural laboratory examination: Secondary | ICD-10-CM | POA: Insufficient documentation

## 2014-09-10 DIAGNOSIS — Z0183 Encounter for blood typing: Secondary | ICD-10-CM | POA: Diagnosis not present

## 2014-09-10 HISTORY — DX: Myoneural disorder, unspecified: G70.9

## 2014-09-10 HISTORY — DX: Pneumonia, unspecified organism: J18.9

## 2014-09-10 HISTORY — DX: Personal history of other diseases of the digestive system: Z87.19

## 2014-09-10 HISTORY — DX: Unspecified osteoarthritis, unspecified site: M19.90

## 2014-09-10 LAB — COMPREHENSIVE METABOLIC PANEL
ALT: 17 U/L (ref 17–63)
AST: 19 U/L (ref 15–41)
Albumin: 3.8 g/dL (ref 3.5–5.0)
Alkaline Phosphatase: 46 U/L (ref 38–126)
Anion gap: 8 (ref 5–15)
BUN: 12 mg/dL (ref 6–20)
CO2: 25 mmol/L (ref 22–32)
Calcium: 9.1 mg/dL (ref 8.9–10.3)
Chloride: 107 mmol/L (ref 101–111)
Creatinine, Ser: 0.91 mg/dL (ref 0.61–1.24)
GFR calc Af Amer: >60 mL/min (ref >60)
GFR calc non Af Amer: >60 mL/min (ref >60)
Glucose, Bld: 93 mg/dL (ref 65–99)
POTASSIUM: 4.5 mmol/L (ref 3.5–5.1)
SODIUM: 140 mmol/L (ref 135–145)
Total Bilirubin: 0.5 mg/dL (ref 0.3–1.2)
Total Protein: 6.9 g/dL (ref 6.5–8.1)

## 2014-09-10 LAB — CBC WITH DIFFERENTIAL/PLATELET
Basophils Absolute: 0 10*3/uL (ref 0.0–0.1)
Basophils Relative: 0 % (ref 0–1)
EOS ABS: 0.1 10*3/uL (ref 0.0–0.7)
Eosinophils Relative: 2 % (ref 0–5)
HCT: 42.7 % (ref 39.0–52.0)
HEMOGLOBIN: 14.6 g/dL (ref 13.0–17.0)
Lymphocytes Relative: 27 % (ref 12–46)
Lymphs Abs: 1.8 10*3/uL (ref 0.7–4.0)
MCH: 30.8 pg (ref 26.0–34.0)
MCHC: 34.2 g/dL (ref 30.0–36.0)
MCV: 90.1 fL (ref 78.0–100.0)
MONOS PCT: 7 % (ref 3–12)
Monocytes Absolute: 0.5 10*3/uL (ref 0.1–1.0)
NEUTROS PCT: 64 % (ref 43–77)
Neutro Abs: 4.3 10*3/uL (ref 1.7–7.7)
PLATELETS: 172 10*3/uL (ref 150–400)
RBC: 4.74 MIL/uL (ref 4.22–5.81)
RDW: 12.5 % (ref 11.5–15.5)
WBC: 6.7 10*3/uL (ref 4.0–10.5)

## 2014-09-10 LAB — PROTIME-INR
INR: 1.02 (ref 0.00–1.49)
Prothrombin Time: 13.6 seconds (ref 11.6–15.2)

## 2014-09-10 LAB — URINALYSIS, ROUTINE W REFLEX MICROSCOPIC
Bilirubin Urine: NEGATIVE
Glucose, UA: NEGATIVE mg/dL
Hgb urine dipstick: NEGATIVE
Ketones, ur: NEGATIVE mg/dL
LEUKOCYTES UA: NEGATIVE
NITRITE: NEGATIVE
PH: 6 (ref 5.0–8.0)
Protein, ur: NEGATIVE mg/dL
Specific Gravity, Urine: 1.025 (ref 1.005–1.030)
UROBILINOGEN UA: 1 mg/dL (ref 0.0–1.0)

## 2014-09-10 LAB — TYPE AND SCREEN
ABO/RH(D): A POS
ANTIBODY SCREEN: NEGATIVE

## 2014-09-10 LAB — SURGICAL PCR SCREEN
MRSA, PCR: NEGATIVE
Staphylococcus aureus: NEGATIVE

## 2014-09-10 LAB — APTT: APTT: 33 s (ref 24–37)

## 2014-09-10 NOTE — Pre-Procedure Instructions (Signed)
    Caren HazyWilliam A Stiggers  09/10/2014      [id number] - [place], [place] - [address]  [place] [id number] Phone: [phone] Fax: [phone]    Your procedure is scheduled on 09/19/14 at 730 AM.  [facility].M.  Call this number if you have problems the morning of surgery:  [phone]   Remember:  Do not eat food or drink liquids after midnight.  Take these medicines the morning of surgery with A SIP OF WATER Vicodin   Do not take any NSAIDs, Aspirins, Vitamins, Supplements, Herbal Medications, Fish Oils, or Goody's/BC powders from now until after surgery as directed by your doctor.    Do not wear jewelry.  Do not wear lotions, powders, or perfumes.  You may wear deodorant.  Men may shave face and neck.  Do not bring valuables to the hospital.  [facility] is not responsible for any belongings or valuables.  Contacts, dentures or bridgework may not be worn into surgery.  Leave your suitcase in the car.  After surgery it may be brought to your room.  For patients admitted to the hospital, discharge time will be determined by your treatment team.  Patients discharged the day of surgery will not be allowed to drive home.    Please read over the following fact sheets that you were given. Pain Booklet, Coughing and Deep Breathing, Blood Transfusion Information, MRSA Information and Surgical Site Infection Prevention

## 2014-09-18 MED ORDER — CEFAZOLIN SODIUM-DEXTROSE 2-3 GM-% IV SOLR
2.0000 g | INTRAVENOUS | Status: AC
  Administered 2014-09-19: 2 g via INTRAVENOUS
  Filled 2014-09-18: qty 50

## 2014-09-18 NOTE — Anesthesia Preprocedure Evaluation (Addendum)
Anesthesia Evaluation  Patient identified by MRN, date of birth, ID band Patient awake    Reviewed: Allergy & Precautions, NPO status , Patient's Chart, lab work & pertinent test results, reviewed documented beta blocker date and time   Airway Mallampati: III   Neck ROM: Full    Dental  (+) Dental Advisory Given, Teeth Intact   Pulmonary asthma , Current Smoker,  breath sounds clear to auscultation        Cardiovascular Rhythm:Regular  EKG WNL   Neuro/Psych    GI/Hepatic Neg liver ROS, hiatal hernia,   Endo/Other  negative endocrine ROS  Renal/GU negative Renal ROS     Musculoskeletal   Abdominal (+)  Abdomen: soft.    Peds  Hematology 14/42   Anesthesia Other Findings   Reproductive/Obstetrics                           Anesthesia Physical Anesthesia Plan  ASA: II  Anesthesia Plan: Spinal   Post-op Pain Management:    Induction: Intravenous  Airway Management Planned: Nasal Cannula  Additional Equipment:   Intra-op Plan:   Post-operative Plan:   Informed Consent: I have reviewed the patients History and Physical, chart, labs and discussed the procedure including the risks, benefits and alternatives for the proposed anesthesia with the patient or authorized representative who has indicated his/her understanding and acceptance.     Plan Discussed with:   Anesthesia Plan Comments: (Will offer Spinal)        Anesthesia Quick Evaluation

## 2014-09-19 ENCOUNTER — Encounter (HOSPITAL_COMMUNITY)
Admission: RE | Disposition: A | Discharge status: Home Health | Payer: Self-pay | Source: Ambulatory Visit | Attending: Orthopedic Surgery

## 2014-09-19 ENCOUNTER — Inpatient Hospital Stay (HOSPITAL_COMMUNITY): Payer: Worker's Compensation | Admitting: Anesthesiology

## 2014-09-19 ENCOUNTER — Inpatient Hospital Stay (HOSPITAL_COMMUNITY)
Admission: RE | Admit: 2014-09-19 | Discharge: 2014-09-21 | DRG: 468 | Disposition: A | Discharge status: Home Health | Payer: Worker's Compensation | Source: Ambulatory Visit | Attending: Orthopedic Surgery | Admitting: Orthopedic Surgery

## 2014-09-19 ENCOUNTER — Encounter (HOSPITAL_COMMUNITY): Payer: Self-pay | Admitting: *Deleted

## 2014-09-19 DIAGNOSIS — Z79899 Other long term (current) drug therapy: Secondary | ICD-10-CM

## 2014-09-19 DIAGNOSIS — Y831 Surgical operation with implant of artificial internal device as the cause of abnormal reaction of the patient, or of later complication, without mention of misadventure at the time of the procedure: Secondary | ICD-10-CM | POA: Diagnosis present

## 2014-09-19 DIAGNOSIS — F17210 Nicotine dependence, cigarettes, uncomplicated: Secondary | ICD-10-CM | POA: Diagnosis present

## 2014-09-19 DIAGNOSIS — T84038A Mechanical loosening of other internal prosthetic joint, initial encounter: Secondary | ICD-10-CM

## 2014-09-19 DIAGNOSIS — H9190 Unspecified hearing loss, unspecified ear: Secondary | ICD-10-CM | POA: Diagnosis present

## 2014-09-19 DIAGNOSIS — T84032A Mechanical loosening of internal right knee prosthetic joint, initial encounter: Principal | ICD-10-CM | POA: Diagnosis present

## 2014-09-19 DIAGNOSIS — Z96659 Presence of unspecified artificial knee joint: Secondary | ICD-10-CM

## 2014-09-19 DIAGNOSIS — Z7982 Long term (current) use of aspirin: Secondary | ICD-10-CM

## 2014-09-19 DIAGNOSIS — M25561 Pain in right knee: Secondary | ICD-10-CM | POA: Diagnosis present

## 2014-09-19 HISTORY — PX: TOTAL KNEE REVISION: SHX996

## 2014-09-19 SURGERY — TOTAL KNEE REVISION
Anesthesia: Spinal | Site: Knee | Laterality: Right

## 2014-09-19 MED ORDER — OXYCODONE-ACETAMINOPHEN 5-325 MG PO TABS: 1.0000 | ORAL_TABLET | ORAL | Status: AC | PRN

## 2014-09-19 MED ORDER — PROPOFOL 10 MG/ML IV BOLUS
INTRAVENOUS | Status: AC
  Filled 2014-09-19: qty 20

## 2014-09-19 MED ORDER — MIDAZOLAM HCL 2 MG/2ML IJ SOLN: 2.0000 mg | Freq: Once | INTRAMUSCULAR | Status: DC

## 2014-09-19 MED ORDER — OXYCODONE-ACETAMINOPHEN 5-325 MG PO TABS
ORAL_TABLET | ORAL | Status: AC
  Filled 2014-09-19: qty 2

## 2014-09-19 MED ORDER — NEOSTIGMINE METHYLSULFATE 10 MG/10ML IV SOLN
INTRAVENOUS | Status: AC
  Filled 2014-09-19: qty 1

## 2014-09-19 MED ORDER — CEFAZOLIN SODIUM-DEXTROSE 2-3 GM-% IV SOLR
2.0000 g | Freq: Four times a day (QID) | INTRAVENOUS | Status: AC
  Administered 2014-09-19 – 2014-09-20 (×4): 2 g via INTRAVENOUS
  Filled 2014-09-19 (×4): qty 50

## 2014-09-19 MED ORDER — VANCOMYCIN HCL 1000 MG IV SOLR
INTRAVENOUS | Status: AC
  Filled 2014-09-19: qty 2000

## 2014-09-19 MED ORDER — ALUM & MAG HYDROXIDE-SIMETH 200-200-20 MG/5ML PO SUSP: 30.0000 mL | ORAL | Status: DC | PRN

## 2014-09-19 MED ORDER — ALBUTEROL SULFATE (2.5 MG/3ML) 0.083% IN NEBU
2.5000 mg | INHALATION_SOLUTION | Freq: Once | RESPIRATORY_TRACT | Status: AC
  Administered 2014-09-19: 2.5 mg via RESPIRATORY_TRACT

## 2014-09-19 MED ORDER — METHOCARBAMOL 750 MG PO TABS: 750.0000 mg | ORAL_TABLET | Freq: Three times a day (TID) | ORAL | Status: AC | PRN

## 2014-09-19 MED ORDER — ASPIRIN EC 325 MG PO TBEC: 325.0000 mg | DELAYED_RELEASE_TABLET | Freq: Two times a day (BID) | ORAL | Status: AC

## 2014-09-19 MED ORDER — HYDROMORPHONE HCL 1 MG/ML IJ SOLN
INTRAMUSCULAR | Status: AC
  Filled 2014-09-19: qty 1

## 2014-09-19 MED ORDER — ONDANSETRON HCL 4 MG/2ML IJ SOLN: 4.0000 mg | Freq: Four times a day (QID) | INTRAMUSCULAR | Status: DC | PRN

## 2014-09-19 MED ORDER — METHOCARBAMOL 500 MG PO TABS
ORAL_TABLET | ORAL | Status: AC
  Filled 2014-09-19: qty 1

## 2014-09-19 MED ORDER — DEXAMETHASONE SODIUM PHOSPHATE 10 MG/ML IJ SOLN
20.0000 mg | Freq: Two times a day (BID) | INTRAMUSCULAR | Status: AC
  Administered 2014-09-19 – 2014-09-20 (×2): 20 mg via INTRAVENOUS
  Filled 2014-09-19 (×2): qty 2

## 2014-09-19 MED ORDER — DOCUSATE SODIUM 100 MG PO CAPS
100.0000 mg | ORAL_CAPSULE | Freq: Two times a day (BID) | ORAL | Status: DC
  Administered 2014-09-19 – 2014-09-21 (×4): 100 mg via ORAL
  Filled 2014-09-19 (×3): qty 1

## 2014-09-19 MED ORDER — BUPIVACAINE LIPOSOME 1.3 % IJ SUSP
INTRAMUSCULAR | Status: DC | PRN
  Administered 2014-09-19: 20 mL

## 2014-09-19 MED ORDER — SODIUM CHLORIDE 0.9 % IR SOLN
Status: DC | PRN
  Administered 2014-09-19: 1000 mL

## 2014-09-19 MED ORDER — MIDAZOLAM HCL 2 MG/2ML IJ SOLN
INTRAMUSCULAR | Status: AC
  Filled 2014-09-19: qty 2

## 2014-09-19 MED ORDER — PROMETHAZINE HCL 25 MG/ML IJ SOLN: 12.5000 mg | Freq: Four times a day (QID) | INTRAMUSCULAR | Status: DC | PRN

## 2014-09-19 MED ORDER — ASPIRIN EC 325 MG PO TBEC
325.0000 mg | DELAYED_RELEASE_TABLET | Freq: Two times a day (BID) | ORAL | Status: DC
  Administered 2014-09-19 – 2014-09-21 (×4): 325 mg via ORAL
  Filled 2014-09-19 (×4): qty 1

## 2014-09-19 MED ORDER — POLYETHYLENE GLYCOL 3350 17 G PO PACK
17.0000 g | PACK | Freq: Every day | ORAL | Status: DC | PRN
Start: 2014-09-19 — End: 2014-09-21

## 2014-09-19 MED ORDER — ACETAMINOPHEN 10 MG/ML IV SOLN
1000.0000 mg | Freq: Once | INTRAVENOUS | Status: AC
  Administered 2014-09-19: 1000 mg via INTRAVENOUS

## 2014-09-19 MED ORDER — FENTANYL CITRATE (PF) 250 MCG/5ML IJ SOLN
INTRAMUSCULAR | Status: AC
  Filled 2014-09-19: qty 5

## 2014-09-19 MED ORDER — LIDOCAINE HCL (CARDIAC) 20 MG/ML IV SOLN
INTRAVENOUS | Status: AC
  Filled 2014-09-19: qty 5

## 2014-09-19 MED ORDER — BUPIVACAINE IN DEXTROSE 0.75-8.25 % IT SOLN
INTRATHECAL | Status: DC | PRN
  Administered 2014-09-19: 15 mg via INTRATHECAL

## 2014-09-19 MED ORDER — DEXAMETHASONE SODIUM PHOSPHATE 10 MG/ML IJ SOLN
INTRAMUSCULAR | Status: AC
  Filled 2014-09-19: qty 1

## 2014-09-19 MED ORDER — TOBRAMYCIN SULFATE 1.2 G IJ SOLR
INTRAMUSCULAR | Status: AC
  Filled 2014-09-19: qty 2.4

## 2014-09-19 MED ORDER — METHOCARBAMOL 500 MG PO TABS
500.0000 mg | ORAL_TABLET | Freq: Four times a day (QID) | ORAL | Status: DC | PRN
  Administered 2014-09-19 – 2014-09-21 (×6): 500 mg via ORAL
  Filled 2014-09-19 (×6): qty 1

## 2014-09-19 MED ORDER — ACETAMINOPHEN 10 MG/ML IV SOLN
INTRAVENOUS | Status: AC
  Administered 2014-09-19: 1000 mg via INTRAVENOUS
  Filled 2014-09-19: qty 100

## 2014-09-19 MED ORDER — HYDROMORPHONE HCL 1 MG/ML IJ SOLN
0.2500 mg | INTRAMUSCULAR | Status: DC | PRN
  Administered 2014-09-19: 1 mg via INTRAVENOUS
  Administered 2014-09-19 (×2): 0.5 mg via INTRAVENOUS

## 2014-09-19 MED ORDER — OXYCODONE-ACETAMINOPHEN 5-325 MG PO TABS
1.0000 | ORAL_TABLET | ORAL | Status: DC | PRN
  Administered 2014-09-19 – 2014-09-21 (×11): 2 via ORAL
  Filled 2014-09-19 (×10): qty 2

## 2014-09-19 MED ORDER — GLYCOPYRROLATE 0.2 MG/ML IJ SOLN
INTRAMUSCULAR | Status: AC
  Filled 2014-09-19: qty 4

## 2014-09-19 MED ORDER — DIPHENHYDRAMINE HCL 12.5 MG/5ML PO ELIX: 12.5000 mg | ORAL_SOLUTION | ORAL | Status: DC | PRN

## 2014-09-19 MED ORDER — MEPERIDINE HCL 25 MG/ML IJ SOLN: 6.2500 mg | INTRAMUSCULAR | Status: DC | PRN

## 2014-09-19 MED ORDER — SODIUM CHLORIDE 0.9 % IV SOLN
INTRAVENOUS | Status: DC
  Administered 2014-09-19 – 2014-09-20 (×2): via INTRAVENOUS

## 2014-09-19 MED ORDER — BUPIVACAINE LIPOSOME 1.3 % IJ SUSP
20.0000 mL | INTRAMUSCULAR | Status: DC
  Filled 2014-09-19: qty 20

## 2014-09-19 MED ORDER — LIDOCAINE HCL (CARDIAC) 20 MG/ML IV SOLN
INTRAVENOUS | Status: DC | PRN
  Administered 2014-09-19: 40 mg via INTRAVENOUS

## 2014-09-19 MED ORDER — ALBUTEROL SULFATE (2.5 MG/3ML) 0.083% IN NEBU
INHALATION_SOLUTION | RESPIRATORY_TRACT | Status: AC
  Filled 2014-09-19: qty 3

## 2014-09-19 MED ORDER — BUPIVACAINE HCL (PF) 0.25 % IJ SOLN
INTRAMUSCULAR | Status: DC | PRN
  Administered 2014-09-19: 20 mL

## 2014-09-19 MED ORDER — ROCURONIUM BROMIDE 50 MG/5ML IV SOLN
INTRAVENOUS | Status: AC
  Filled 2014-09-19: qty 1

## 2014-09-19 MED ORDER — HYDROMORPHONE HCL 1 MG/ML IJ SOLN
1.0000 mg | INTRAMUSCULAR | Status: DC | PRN
  Administered 2014-09-19: 1 mg via INTRAVENOUS
  Filled 2014-09-19: qty 1

## 2014-09-19 MED ORDER — PROMETHAZINE HCL 25 MG/ML IJ SOLN: 6.2500 mg | INTRAMUSCULAR | Status: DC | PRN

## 2014-09-19 MED ORDER — MINERAL OIL LIGHT 100 % EX OIL
TOPICAL_OIL | CUTANEOUS | Status: AC
  Filled 2014-09-19: qty 25

## 2014-09-19 MED ORDER — ONDANSETRON HCL 4 MG/2ML IJ SOLN
INTRAMUSCULAR | Status: DC | PRN
  Administered 2014-09-19: 4 mg via INTRAVENOUS

## 2014-09-19 MED ORDER — KETOROLAC TROMETHAMINE 15 MG/ML IJ SOLN
INTRAMUSCULAR | Status: AC
  Filled 2014-09-19: qty 1

## 2014-09-19 MED ORDER — PROPOFOL INFUSION 10 MG/ML OPTIME
INTRAVENOUS | Status: DC | PRN
  Administered 2014-09-19: 100 ug/kg/min via INTRAVENOUS

## 2014-09-19 MED ORDER — VANCOMYCIN HCL 1000 MG IV SOLR
INTRAVENOUS | Status: AC
  Filled 2014-09-19: qty 1000

## 2014-09-19 MED ORDER — CEFUROXIME SODIUM 1.5 G IJ SOLR
INTRAMUSCULAR | Status: DC | PRN
  Administered 2014-09-19: 1.5 g

## 2014-09-19 MED ORDER — ACETAMINOPHEN 650 MG RE SUPP: 650.0000 mg | Freq: Four times a day (QID) | RECTAL | Status: DC | PRN

## 2014-09-19 MED ORDER — FENTANYL CITRATE (PF) 100 MCG/2ML IJ SOLN
INTRAMUSCULAR | Status: DC | PRN
  Administered 2014-09-19: 100 ug via INTRAVENOUS
  Administered 2014-09-19 (×3): 50 ug via INTRAVENOUS

## 2014-09-19 MED ORDER — ACETAMINOPHEN 325 MG PO TABS: 650.0000 mg | ORAL_TABLET | Freq: Four times a day (QID) | ORAL | Status: DC | PRN

## 2014-09-19 MED ORDER — SODIUM CHLORIDE 0.9 % IV SOLN
1000.0000 mg | INTRAVENOUS | Status: AC
  Administered 2014-09-19: 1000 mg via INTRAVENOUS
  Filled 2014-09-19: qty 10

## 2014-09-19 MED ORDER — CEFUROXIME SODIUM 1.5 G IJ SOLR
INTRAMUSCULAR | Status: AC
  Filled 2014-09-19: qty 1.5

## 2014-09-19 MED ORDER — ONDANSETRON HCL 4 MG/2ML IJ SOLN
INTRAMUSCULAR | Status: AC
  Filled 2014-09-19: qty 2

## 2014-09-19 MED ORDER — ONDANSETRON HCL 4 MG PO TABS
4.0000 mg | ORAL_TABLET | Freq: Four times a day (QID) | ORAL | Status: DC | PRN
  Filled 2014-09-19: qty 1

## 2014-09-19 MED ORDER — CHLORHEXIDINE GLUCONATE 4 % EX LIQD: 60.0000 mL | Freq: Once | CUTANEOUS | Status: DC

## 2014-09-19 MED ORDER — ZOLPIDEM TARTRATE 5 MG PO TABS
5.0000 mg | ORAL_TABLET | Freq: Every evening | ORAL | Status: DC | PRN
  Administered 2014-09-19: 5 mg via ORAL
  Filled 2014-09-19: qty 1

## 2014-09-19 MED ORDER — METHOCARBAMOL 1000 MG/10ML IJ SOLN
500.0000 mg | Freq: Four times a day (QID) | INTRAMUSCULAR | Status: DC | PRN
  Filled 2014-09-19: qty 5

## 2014-09-19 MED ORDER — MIDAZOLAM HCL 5 MG/5ML IJ SOLN
INTRAMUSCULAR | Status: DC | PRN
  Administered 2014-09-19: 2 mg via INTRAVENOUS

## 2014-09-19 MED ORDER — KETOROLAC TROMETHAMINE 15 MG/ML IJ SOLN
15.0000 mg | Freq: Three times a day (TID) | INTRAMUSCULAR | Status: AC
  Administered 2014-09-19 – 2014-09-20 (×4): 15 mg via INTRAVENOUS
  Filled 2014-09-19 (×4): qty 1

## 2014-09-19 MED ORDER — LACTATED RINGERS IV SOLN
INTRAVENOUS | Status: DC | PRN
  Administered 2014-09-19 (×2): via INTRAVENOUS

## 2014-09-19 MED ORDER — BISACODYL 5 MG PO TBEC: 5.0000 mg | DELAYED_RELEASE_TABLET | Freq: Every day | ORAL | Status: DC | PRN

## 2014-09-19 SURGICAL SUPPLY — 71 items
BANDAGE ELASTIC 6 VELCRO ST LF (GAUZE/BANDAGES/DRESSINGS) ×3 IMPLANT
BANDAGE ESMARK 6X9 LF (GAUZE/BANDAGES/DRESSINGS) ×1 IMPLANT
BENZOIN TINCTURE PRP APPL 2/3 (GAUZE/BANDAGES/DRESSINGS) ×3 IMPLANT
BLADE SAGITTAL 25.0X1.19X90 (BLADE) ×2 IMPLANT
BLADE SAGITTAL 25.0X1.19X90MM (BLADE) ×1
BLADE SAW SGTL 13X75X1.27 (BLADE) ×3 IMPLANT
BNDG ESMARK 6X9 LF (GAUZE/BANDAGES/DRESSINGS) ×3
BOWL SMART MIX CTS (DISPOSABLE) ×3 IMPLANT
CEMENT HV SMART SET (Cement) ×6 IMPLANT
CLOSURE WOUND 1/2 X4 (GAUZE/BANDAGES/DRESSINGS) ×1
COVER BACK TABLE 24X17X13 BIG (DRAPES) IMPLANT
COVER SURGICAL LIGHT HANDLE (MISCELLANEOUS) ×3 IMPLANT
CUFF TOURNIQUET SINGLE 34IN LL (TOURNIQUET CUFF) IMPLANT
CUFF TOURNIQUET SINGLE 44IN (TOURNIQUET CUFF) IMPLANT
DRAPE EXTREMITY T 121X128X90 (DRAPE) ×3 IMPLANT
DRAPE IMP U-DRAPE 54X76 (DRAPES) ×3 IMPLANT
DRAPE U-SHAPE 47X51 STRL (DRAPES) ×3 IMPLANT
DRSG AQUACEL AG ADV 3.5X10 (GAUZE/BANDAGES/DRESSINGS) ×3 IMPLANT
DRSG PAD ABDOMINAL 8X10 ST (GAUZE/BANDAGES/DRESSINGS) ×3 IMPLANT
DURAPREP 26ML APPLICATOR (WOUND CARE) ×3 IMPLANT
ELECT REM PT RETURN 9FT ADLT (ELECTROSURGICAL) ×3
ELECTRODE REM PT RTRN 9FT ADLT (ELECTROSURGICAL) ×1 IMPLANT
EVACUATOR 1/8 PVC DRAIN (DRAIN) ×3 IMPLANT
FACESHIELD WRAPAROUND (MASK) ×3 IMPLANT
GAUZE SPONGE 4X4 12PLY STRL (GAUZE/BANDAGES/DRESSINGS) ×3 IMPLANT
GAUZE XEROFORM 5X9 LF (GAUZE/BANDAGES/DRESSINGS) ×3 IMPLANT
GLOVE BIOGEL PI IND STRL 8 (GLOVE) ×2 IMPLANT
GLOVE BIOGEL PI INDICATOR 8 (GLOVE) ×4
GLOVE ECLIPSE 7.5 STRL STRAW (GLOVE) ×6 IMPLANT
GOWN STRL REUS W/ TWL LRG LVL3 (GOWN DISPOSABLE) ×1 IMPLANT
GOWN STRL REUS W/ TWL XL LVL3 (GOWN DISPOSABLE) ×2 IMPLANT
GOWN STRL REUS W/TWL LRG LVL3 (GOWN DISPOSABLE) ×2
GOWN STRL REUS W/TWL XL LVL3 (GOWN DISPOSABLE) ×4
HANDPIECE INTERPULSE COAX TIP (DISPOSABLE) ×2
HOOD PEEL AWAY FACE SHEILD DIS (HOOD) ×9 IMPLANT
IMMOBILIZER KNEE 20 (SOFTGOODS) IMPLANT
IMMOBILIZER KNEE 22 UNIV (SOFTGOODS) IMPLANT
IMMOBILIZER KNEE 24 THIGH 36 (MISCELLANEOUS) IMPLANT
IMMOBILIZER KNEE 24 UNIV (MISCELLANEOUS)
KIT BASIN OR (CUSTOM PROCEDURE TRAY) ×3 IMPLANT
KIT ROOM TURNOVER OR (KITS) ×3 IMPLANT
MANIFOLD NEPTUNE II (INSTRUMENTS) ×3 IMPLANT
NEEDLE HYPO 25GX1X1/2 BEV (NEEDLE) ×3 IMPLANT
NS IRRIG 1000ML POUR BTL (IV SOLUTION) ×3 IMPLANT
PACK TOTAL JOINT (CUSTOM PROCEDURE TRAY) ×3 IMPLANT
PACK UNIVERSAL I (CUSTOM PROCEDURE TRAY) ×3 IMPLANT
PAD ARMBOARD 7.5X6 YLW CONV (MISCELLANEOUS) ×6 IMPLANT
PAD CAST 4YDX4 CTTN HI CHSV (CAST SUPPLIES) ×1 IMPLANT
PADDING CAST COTTON 4X4 STRL (CAST SUPPLIES) ×2
PLATE ROT INSERT 12.5MM (Plate) ×3 IMPLANT
RESTRICTOR CEMENT SZ 5 C-STEM (Cement) ×3 IMPLANT
SET HNDPC FAN SPRY TIP SCT (DISPOSABLE) ×1 IMPLANT
STAPLER VISISTAT 35W (STAPLE) IMPLANT
STEM TIBIA PFC 13X30MM (Stem) ×3 IMPLANT
STRIP CLOSURE SKIN 1/2X4 (GAUZE/BANDAGES/DRESSINGS) ×2 IMPLANT
SUCTION FRAZIER TIP 10 FR DISP (SUCTIONS) ×3 IMPLANT
SUT MON AB 3-0 SH 27 (SUTURE)
SUT MON AB 3-0 SH27 (SUTURE) IMPLANT
SUT VIC AB 0 CTB1 27 (SUTURE) ×6 IMPLANT
SUT VIC AB 1 CT1 27 (SUTURE) ×4
SUT VIC AB 1 CT1 27XBRD ANBCTR (SUTURE) ×2 IMPLANT
SUT VIC AB 2-0 CTB1 (SUTURE) ×6 IMPLANT
SYR CONTROL 10ML LL (SYRINGE) ×3 IMPLANT
SYRINGE 60CC LL (MISCELLANEOUS) ×3 IMPLANT
TOWEL OR 17X24 6PK STRL BLUE (TOWEL DISPOSABLE) ×3 IMPLANT
TOWEL OR 17X26 10 PK STRL BLUE (TOWEL DISPOSABLE) ×3 IMPLANT
TRAY SLEEVE CEM ML (Knees) ×3 IMPLANT
TRAY TIB SZ 5 REVISION (Knees) ×3 IMPLANT
TUBE ANAEROBIC SPECIMEN COL (MISCELLANEOUS) ×6 IMPLANT
WATER STERILE IRR 1000ML POUR (IV SOLUTION) ×9 IMPLANT
WEDGE SZ 5 5MM 10MM (Knees) ×6 IMPLANT

## 2014-09-19 NOTE — Anesthesia Procedure Notes (Signed)
Spinal Patient location during procedure: OR Start time: 09/19/2014 7:42 AM End time: 09/19/2014 7:52 AM Staffing Anesthesiologist: Sebastian AcheMANNY, Micca Matura Preanesthetic Checklist Completed: patient identified, site marked, surgical consent, pre-op evaluation, timeout performed, IV checked, risks and benefits discussed and monitors and equipment checked Spinal Block Patient position: sitting Prep: Betadine and DuraPrep Patient monitoring: heart rate, cardiac monitor and blood pressure Approach: midline Location: L3-4 Injection technique: single-shot Needle Needle gauge: 24 G Needle length: 9 cm Needle insertion depth: 6 cm Assessment Sensory level: T6 Additional Notes 15mg  heavy marcaine, no complications

## 2014-09-19 NOTE — Anesthesia Postprocedure Evaluation (Signed)
  Anesthesia Post-op Note  Patient: Phillip Garrison  Procedure(s) Performed: Procedure(s): TOTAL KNEE REVISION (Right)  Patient Location: PACU  Anesthesia Type:Spinal  Level of Consciousness: awake  Airway and Oxygen Therapy: Patient Spontanous Breathing  Post-op Pain: moderate  Post-op Assessment: Post-op Vital signs reviewed              Post-op Vital Signs: Reviewed and stable  Last Vitals:  Filed Vitals:   09/19/14 1235  BP: 121/78  Pulse: 63  Temp:   Resp: 16    Complications: No apparent anesthesia complications
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
## 2014-09-19 NOTE — Progress Notes (Signed)
Utilization review completed.  L. J. Antwine Agosto RN, BSN, CM 

## 2014-09-19 NOTE — Transfer of Care (Signed)
Immediate Anesthesia Transfer of Care Note  Patient: Phillip Garrison  Procedure(s) Performed: Procedure(s): TOTAL KNEE REVISION (Right)  Patient Location: PACU  Anesthesia Type:Spinal  Level of Consciousness: awake  Airway & Oxygen Therapy: Patient Spontanous Breathing and Patient connected to nasal cannula oxygen  Post-op Assessment: Report given to RN and Post -op Vital signs reviewed and stable  Post vital signs: stable  Last Vitals:  Filed Vitals:   09/19/14 0549  BP: 139/88  Pulse: 71  Temp: 36.6 C  Resp: 22    Complications: No apparent anesthesia complications

## 2014-09-19 NOTE — Discharge Instructions (Signed)

## 2014-09-19 NOTE — Progress Notes (Signed)
Orthopedic Tech Progress Note Patient Details:  Caren HazyWilliam A Goethe 04/21/1960 [id number]  CPM Right Knee CPM Right Knee: On Right Knee Flexion (Degrees): 90 Right Knee Extension (Degrees): 0 Additional Comments: trapeze bar patient helper Viewed order from doctor's order list  Nikki DomCrawford, Icis Budreau 09/19/2014, 11:43 AM

## 2014-09-19 NOTE — Evaluation (Signed)
Physical Therapy Evaluation Patient Details Name: Phillip Garrison MRN: [id number] DOB: 02/16/1961 Today's Date: 09/19/2014   History of Present Illness  Rt TKA revision  Clinical Impression  Pt is s/p TKA resulting in the deficits listed below (see PT Problem List). Pt will benefit from skilled PT to increase their independence and safety with mobility to allow discharge home with family assistance.      Follow Up Recommendations Home health PT    Equipment Recommendations  None recommended by PT    Recommendations for Other Services       Precautions / Restrictions Precautions Precautions: Knee;Fall Restrictions Weight Bearing Restrictions: Yes RLE Weight Bearing: Weight bearing as tolerated      Mobility  Bed Mobility Overal bed mobility: Needs Assistance Bed Mobility: Supine to Sit;Sit to Supine     Supine to sit: Supervision Sit to supine: Min assist      Transfers                    Ambulation/Gait                Stairs            Wheelchair Mobility    Modified Rankin (Stroke Patients Only)       Balance Overall balance assessment: Needs assistance Sitting-balance support: Bilateral upper extremity supported Sitting balance-Leahy Scale: Poor                                       Pertinent Vitals/Pain Pain Assessment: 0-10 Pain Score: 8  Pain Location: Rt knee Pain Descriptors / Indicators: Aching Pain Intervention(s): Monitored during session;Repositioned    Home Living Family/patient expects to be discharged to:: Private residence Living Arrangements: Spouse/significant other Available Help at Discharge: Family Type of Home: House Home Access: Stairs to enter Entrance Stairs-Rails: None Entrance Stairs-Number of Steps: 1 Home Layout: One level Home Equipment: Environmental consultantWalker - 2 wheels;Cane - single point      Prior Function Level of Independence: Independent               Hand Dominance         Extremity/Trunk Assessment               Lower Extremity Assessment: Generalized weakness         Communication   Communication: No difficulties  Cognition Arousal/Alertness: Lethargic Behavior During Therapy: WFL for tasks assessed/performed Overall Cognitive Status: Within Functional Limits for tasks assessed                      General Comments      Exercises        Assessment/Plan    PT Assessment Patient needs continued PT services  PT Diagnosis Difficulty walking;Acute pain;Generalized weakness   PT Problem List Decreased strength;Decreased range of motion;Decreased activity tolerance;Decreased balance;Decreased mobility  PT Treatment Interventions DME instruction;Gait training;Stair training;Functional mobility training;Therapeutic activities;Therapeutic exercise;Balance training;Patient/family education   PT Goals (Current goals can be found in the Care Plan section) Acute Rehab PT Goals Patient Stated Goal: To be able go home and get rid of pain PT Goal Formulation: With patient Time For Goal Achievement: 10/03/14 Potential to Achieve Goals: Good    Frequency 7X/week   Barriers to discharge        Co-evaluation               End  of Session Equipment Utilized During Treatment: Oxygen Activity Tolerance: Patient limited by pain;Patient limited by lethargy Patient left: in bed;with call bell/phone within reach;with SCD's reapplied Nurse Communication: Mobility status         Time: 1545-1600 PT Time Calculation (min) (ACUTE ONLY): 15 min   Charges:   PT Evaluation $Initial PT Evaluation Tier I: 1 Procedure     PT G Codes:        Christiane Ha, PT, CSCS Pager [phone] Office [phone]  09/19/2014, 4:08 PM

## 2014-09-19 NOTE — Brief Op Note (Signed)
09/19/2014  10:41 AM  PATIENT:  Phillip Garrison  54 y.o. male  PRE-OPERATIVE DIAGNOSIS:  painful right total knee  POST-OPERATIVE DIAGNOSIS:  painful right total knee  PROCEDURE:  Procedure(s): TOTAL KNEE REVISION (Right)  SURGEON:  Surgeon(s) and Role:    * Jodi GeraldsJohn Nakai Pollio, MD - Primary  PHYSICIAN ASSISTANT:   ASSISTANTS: bethune   ANESTHESIA:   general  EBL:  Total I/O In: 1000 [I.V.:1000] Out: -   BLOOD ADMINISTERED:none  DRAINS: (1) Hemovact drain(s) in the r knee with  Suction Open   LOCAL MEDICATIONS USED:  OTHER experel  SPECIMEN:  No Specimen and Source of Specimen:  gram stain fluid and synovium  DISPOSITION OF SPECIMEN:  PATHOLOGY  COUNTS:  YES  TOURNIQUET:   Total Tourniquet Time Documented: Thigh (Right) - 76 minutes Thigh (Right) - 32 minutes Total: Thigh (Right) - 108 minutes   DICTATION: .Other Dictation: Dictation Number [id number]  PLAN OF CARE: Admit to inpatient   PATIENT DISPOSITION:  PACU - hemodynamically stable.   Delay start of Pharmacological VTE agent (>24hrs) due to surgical blood loss or risk of bleeding: no

## 2014-09-20 LAB — CBC
HEMATOCRIT: 34.6 % — AB (ref 39.0–52.0)
HEMOGLOBIN: 11.8 g/dL — AB (ref 13.0–17.0)
MCH: 30.7 pg (ref 26.0–34.0)
MCHC: 34.1 g/dL (ref 30.0–36.0)
MCV: 90.1 fL (ref 78.0–100.0)
Platelets: 179 10*3/uL (ref 150–400)
RBC: 3.84 MIL/uL — ABNORMAL LOW (ref 4.22–5.81)
RDW: 12.3 % (ref 11.5–15.5)
WBC: 9.6 10*3/uL (ref 4.0–10.5)

## 2014-09-20 LAB — BASIC METABOLIC PANEL
Anion gap: 5 (ref 5–15)
BUN: 13 mg/dL (ref 6–20)
CALCIUM: 8.7 mg/dL — AB (ref 8.9–10.3)
CO2: 28 mmol/L (ref 22–32)
Chloride: 105 mmol/L (ref 101–111)
Creatinine, Ser: 0.93 mg/dL (ref 0.61–1.24)
GFR calc Af Amer: >60 mL/min (ref >60)
GFR calc non Af Amer: >60 mL/min (ref >60)
GLUCOSE: 163 mg/dL — AB (ref 65–99)
Potassium: 4.6 mmol/L (ref 3.5–5.1)
SODIUM: 138 mmol/L (ref 135–145)

## 2014-09-20 NOTE — Progress Notes (Addendum)
PATIENT ID: Caren HazyWilliam A Maqueda  MRN: [id number]  DOB/AGE:  05/22/1960 / 54 y.o.  1 Day Post-Op Procedure(s) (LRB): TOTAL KNEE REVISION (Right)    PROGRESS NOTE Subjective: Patient is alert, oriented, no Nausea, no Vomiting, yes passing gas, no Bowel Movement. Taking PO well. Denies SOB, Chest or Calf Pain. Using Incentive Spirometer, PAS in place. Ambulate WBAT, CPM 0-60 Patient reports pain as 6 on 0-10 scale and 7 on 0-10 scale  .    Objective: Vital signs in last 24 hours: Filed Vitals:   09/19/14 1500 09/19/14 2012 09/20/14 0019 09/20/14 0442  BP: 116/74 113/73 119/72 122/74  Pulse: 84 75 81 73  Temp: 97.6 F (36.4 C) 98 F (36.7 C) 99.5 F (37.5 C) 97.8 F (36.6 C)  TempSrc:  Oral Oral Oral  Resp: 12 16 18    Height:      Weight:      SpO2: 96% 94% 93% 92%      Intake/Output from previous day: I/O last 3 completed shifts: In: 2140 [P.O.:640; I.V.:1500] Out: 1650 [Urine:750; Drains:700; Blood:200]   Intake/Output this shift:     LABORATORY DATA:  Recent Labs  09/20/14 0410  WBC 9.6  HGB 11.8*  HCT 34.6*  PLT 179  NA 138  K 4.6  CL 105  CO2 28  BUN 13  CREATININE 0.93  GLUCOSE 163*  CALCIUM 8.7*    Examination: Neurologically intact Neurovascular intact Sensation intact distally Intact pulses distally Dorsiflexion/Plantar flexion intact Incision: dressing C/D/I No cellulitis present Compartment soft} Blood and plasma separated in drain indicating minimal recent drainage, drain pulled without difficulty. Assessment:   1 Day Post-Op Procedure(s) (LRB): TOTAL KNEE REVISION (Right) ADDITIONAL DIAGNOSIS: Expected Acute Blood Loss Anemia,  Plan: PT/OT WBAT, CPM 5/hrs day until ROM 0-90 degrees, then D/C CPM DVT Prophylaxis:  SCDx72hrs, ASA 325 mg BID x 2 weeks DISCHARGE PLAN: Home, when pt passes therapy goals, most likely Sunday. DISCHARGE NEEDS: HHPT, HHRN, CPM, Walker and 3-in-1 comode seat     Natoria Archibald R 09/20/2014, 8:13 AM

## 2014-09-20 NOTE — Progress Notes (Signed)
Physical Therapy Treatment Patient Details Name: Phillip HazyWilliam A Garrison MRN: [id number] DOB: 07/10/1960 Today's Date: 09/20/2014    History of Present Illness Rt TKA revision    PT Comments    Provided patient with exercise handout.  Patient able to perform with min cueing.  Follow Up Recommendations  Home health PT     Equipment Recommendations  None recommended by PT    Recommendations for Other Services       Precautions / Restrictions Precautions Precautions: Knee;Fall Restrictions Weight Bearing Restrictions: Yes RLE Weight Bearing: Weight bearing as tolerated    Mobility  Bed Mobility                  Transfers                    Ambulation/Gait                 Stairs            Wheelchair Mobility    Modified Rankin (Stroke Patients Only)       Balance                                    Cognition Arousal/Alertness: Awake/alert Behavior During Therapy: WFL for tasks assessed/performed Overall Cognitive Status: Within Functional Limits for tasks assessed                      Exercises Total Joint Exercises Ankle Circles/Pumps: AROM;Both;10 reps;Supine Quad Sets: AROM;Right;10 reps;Supine Short Arc Quad: AROM;Right;5 reps;Supine Heel Slides: AROM;Right;5 reps;Supine Hip ABduction/ADduction: AROM;Right;5 reps;Supine Straight Leg Raises: AROM;Right;Supine (2 reps)    General Comments        Pertinent Vitals/Pain Pain Assessment: 0-10 Pain Score: 4  Pain Location: Rt knee in supine Pain Descriptors / Indicators: Sore Pain Intervention(s): Repositioned    Home Living                      Prior Function            PT Goals (current goals can now be found in the care plan section) Progress towards PT goals: Progressing toward goals    Frequency  7X/week    PT Plan Current plan remains appropriate    Co-evaluation             End of Session   Activity Tolerance: Patient  tolerated treatment well Patient left: in bed;with call bell/phone within reach     Time: [id number] PT Time Calculation (min) (ACUTE ONLY): 10 min  Charges:  $Therapeutic Exercise: 8-22 mins                    G Codes:      Vena AustriaDavis, Martin Belling H 09/20/2014, 7:42 PM Phillip Garrison, PT, [facility] Acute Rehab [facility] Pager [phone]

## 2014-09-20 NOTE — Progress Notes (Signed)
Physical Therapy Treatment Patient Details Name: Phillip Garrison MRN: [id number] DOB: [birth year] Today's Date: 10/14/14    History of Present Illness Rt TKA revision    PT Comments    Patient making gains with mobility and gait.  Limited by pain today.  Follow Up Recommendations  Home health PT     Equipment Recommendations  None recommended by PT    Recommendations for Other Services       Precautions / Restrictions Precautions Precautions: Knee;Fall Precaution Comments: Reviewed precautions with patient. Restrictions Weight Bearing Restrictions: Yes RLE Weight Bearing: Weight bearing as tolerated    Mobility  Bed Mobility               General bed mobility comments: Patient in chair as PT entered room  Transfers Overall transfer level: Needs assistance Equipment used: Rolling walker (2 wheeled) Transfers: Sit to/from Stand Sit to Stand: Min guard         General transfer comment: Verbal cues for hand placement and technique.  KI on RLE for support.  Ambulation/Gait Ambulation/Gait assistance: Min guard Ambulation Distance (Feet): 40 Feet Assistive device: Rolling walker (2 wheeled) Gait Pattern/deviations: Step-to pattern;Decreased stance time - right;Decreased step length - left;Decreased stride length;Decreased weight shift to right;Antalgic;Trunk flexed Gait velocity: Decreased Gait velocity interpretation: Below normal speed for age/gender General Gait Details: Verbal cues for safe use of RW.  Cues to stand upright during gait - patient with flexed posture.  Distance limited by pain.   Stairs            Wheelchair Mobility    Modified Rankin (Stroke Patients Only)       Balance                                    Cognition Arousal/Alertness: Awake/alert Behavior During Therapy: WFL for tasks assessed/performed Overall Cognitive Status: Within Functional Limits for tasks assessed                       Exercises Total Joint Exercises Ankle Circles/Pumps: AROM;Both;10 reps;Seated Quad Sets: AROM;Right;10 reps;Seated    General Comments        Pertinent Vitals/Pain Pain Assessment: 0-10 Pain Score: 10-Worst pain ever (During gait) Pain Location: Rt knee Pain Descriptors / Indicators: Aching;Sore Pain Intervention(s): Limited activity within patient's tolerance;Repositioned;Patient requesting pain meds-RN notified    Home Living                      Prior Function            PT Goals (current goals can now be found in the care plan section) Acute Rehab PT Goals Patient Stated Goal: To be able go home and get rid of pain Progress towards PT goals: Progressing toward goals    Frequency  7X/week    PT Plan Current plan remains appropriate    Co-evaluation             End of Session Equipment Utilized During Treatment: Gait belt;Oxygen;Right knee immobilizer Activity Tolerance: Patient limited by pain Patient left: in chair;with call bell/phone within reach     Time: 1112-1127 PT Time Calculation (min) (ACUTE ONLY): 15 min  Charges:  $Gait Training: 8-22 mins                    G Codes:      Vena Austria October 14, 2014,  12:30 PM Durenda HurtSusan H. Renaldo Fiddleravis, PT, [facility]MBA Acute Rehab Services Pager [phone]

## 2014-09-20 NOTE — Op Note (Signed)
NAME:  Harvie JuniorKUHL, Kalieb                ACCOUNT NO.:  [id number]  MEDICAL RECORD NO.:  [id number]  LOCATION:  [id number]                        FACILITY:  [facility]  PHYSICIAN:  Harvie JuniorJohn L. Frandy Basnett, M.D.   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  PROCEDURE: 1. Total knee revision of tibial component and polyethylene. 2. Intraoperative decision making over Gram stain and synovial tissue     cultures.  SURGEON:  Harvie JuniorJohn L. Ciarrah Rae, M.D.  ASSISTANT:  Marshia LyJames Bethune, P.A.  ANESTHESIA:  General.  HISTORY:  Mr. Idelle JoKuhl is a 54 year old male with a history of significant complaint of right knee pain.  He ultimately underwent right total knee replacement, did well initially, but ultimately continued to have some pain.  He __________ getting his motion back, a little bit actually got excellent motion.  He continued to complain of pain on an ongoing basis. He was having swelling off and on, had significant effusion.  We had done a workup of him including fluid aspirations and Gram stains and cultures which had been negative multiple times.  We had done workups of laboratory which did not point towards signs of infection; and because of continued ongoing pain, we felt that option for revision was offered to the patient.  He did wish to come to the operating room for this procedure, and he was brought to the operating room for this procedure.  PROCEDURE IN DETAIL:  The patient was brought to the operating room. After adequate anesthesia was obtained with general anesthesia, the patient was placed supine on the operating table.  The right leg was prepped and draped in usual sterile fashion.  Following this, the  leg was exsanguinated and a blood pressure tourniquet was inflated to 350 mmHg.  Following this, a midline incision was made in subcutaneous tissue down to the level of the extensor mechanism.  Medial parapatellar arthrotomy was undertaken and a synovectomy was performed.  The fluid had a Gram stain stat sent.  We took out some extra amounts of tissue that really looked pretty normal at this point; and after sending this tissue, we went throughout.  At this point, we felt that we checked him in flexion, a little bit loose in flexion, it was definitely tight in extension.  It was densely tighter slightly medially than laterally.  At this point, I felt that we had an opportunity to kind of open him up a little bit medially and try to get him into full extension.  So we did a clean-up cut with a little bit of varus bias to take a little bit more off the medial side.  We took about of probably 5 mm the very back medially and about 3 mm laterally.  I removed all the cement that was in there and we worked through this.  We then worked through the process of trying to get a new tibial component __________ with  a standard fibular component, we would have been up to a 22.5 poly.  I felt would be better off having a stable tray and so we went with a 10 buildup and a put a sleeve and just to help control rotation what we were dealing with.  So at this point, we were getting all the components opened to begin to cement the tibia and we got a call from the lab stating that the Gram stain showed rare gram-positive cocci in pairs.  At that point, I was very concerned about the possibility of infection given the long-term situation, but I felt that given that I had just had a laboratory error in the previous week, I felt that I wanted to get some confirmation, so I called and asked Pathology to come take a look at the sample.  They also felt that sending down tissue was appropriate, we had already  sent the tissue down and so I had a conversation with the pathologist.  He went and looked at the Gram stain, they did a separate Gram stain off the same swab which showed no bacteria, never __________ and that the reading of the Gram stain was no bacteria.  This correlated with his pathologic interpretation which showed essentially no white blood cells per high-powered field.  So, I went out and talked with the patient's wife and we reviewed all the __________ with her.  I discussed the case with Dr. Turner Daniels, my partner, who has done a total joint fellowship.  My decision making was essentially that of either the Gram stain remained positive or the white blood cells per high-power field were over 3 that I would do a 2-stage revision, and we were getting all the tools prepared for that.  When I talked to the pathologist, again he was very confident and very direct.  There was no hesitation that the Gram stain was negative and there were no white blood cells per high-power field which matched our clinical perception.  At that point, I felt confident that the right answer was to proceed with the cementing of the tibial component; and at this point, the wound which had copiously and clearly been irrigated for long time.  I had let the tourniquet down while we were awaiting all this information to be obtained from Pathology and elsewhere.  Tourniquet was reinflated, we cemented in the size 5 tibial component with a 10-mm shim buildup in the appropriate rotation; and then after this, we put a 12.5 poly back in and this gave [place] excellent stability in both flexion and extension.  At this point, the wound was copiously irrigated after the cement was allowed to completely harden. We took out the trial poly, controlled all bleeding with electrocautery. Irrigated the wound again and put the final poly and excellent range of motion stability was achieved.  The medial parapatellar arthrotomy was closed  with 1 Vicryl running, skin with 0 and 2-0 Vicryl and skin staples.  Sterile compressive dressing was applied.  The patient was taken to the recovery room and was noted to be in satisfactory condition.  Estimated blood loss for the procedure was minimal.     Harvie Junior, M.D.     Ranae Plumber  D:  09/19/2014  T:  09/20/2014  Job:  [id number]

## 2014-09-21 LAB — CBC
HCT: 33.6 % — ABNORMAL LOW (ref 39.0–52.0)
Hemoglobin: 11.4 g/dL — ABNORMAL LOW (ref 13.0–17.0)
MCH: 30.5 pg (ref 26.0–34.0)
MCHC: 33.9 g/dL (ref 30.0–36.0)
MCV: 89.8 fL (ref 78.0–100.0)
Platelets: 179 10*3/uL (ref 150–400)
RBC: 3.74 MIL/uL — ABNORMAL LOW (ref 4.22–5.81)
RDW: 12.5 % (ref 11.5–15.5)
WBC: 15.4 10*3/uL — ABNORMAL HIGH (ref 4.0–10.5)

## 2014-09-21 NOTE — Care Management Note (Signed)
Case Management Note  Patient Details  Name: Phillip Garrison MRN: [id number] Date of Birth: 07/07/1960  Subjective/Objective:                  TOTAL KNEE REVISION (Right)  Action/Plan:  D/c home w HHPT Expected Discharge Date:       09/21/14           Expected Discharge Plan:  Home w Home Health Services  In-House Referral:     Discharge planning Services  CM Consult  Post Acute Care Choice:    Choice offered to:     DME Arranged:    DME Agency:     HH Arranged:    HH Agency:     Status of Service:  Completed, signed off  Medicare Important Message Given:    Date Medicare IM Given:    Medicare IM give by:    Date Additional Medicare IM Given:    Additional Medicare Important Message give by:     If discussed at Long Length of Stay Meetings, dates discussed:    Additional Comments:  Spoke to patient, was given name of [name] as workman's comp contact.  Stated that HHPT was already set up by [employer]'s comp provider and no additional equipment was needed.  Called [phone] [facility]..was able to verify that the [facility].  No additonal CM needs required at this time.       [name], [name], RN 09/21/2014, 1:40 PM

## 2014-09-21 NOTE — Progress Notes (Signed)
Physical Therapy Treatment Patient Details Name: Phillip Garrison MRN: [id number] DOB: 1961/01/03 Today's Date: 2014/09/26    History of Present Illness Rt TKA revision    PT Comments    Patient doing well with mobility and gait.  Has achieved acute PT goals - patient ready for d/c from PT perspective.  Follow Up Recommendations  Home health PT     Equipment Recommendations  None recommended by PT    Recommendations for Other Services       Precautions / Restrictions Precautions Precautions: Knee;Fall Restrictions Weight Bearing Restrictions: Yes RLE Weight Bearing: Weight bearing as tolerated    Mobility  Bed Mobility                  Transfers Overall transfer level: Needs assistance Equipment used: Rolling walker (2 wheeled) Transfers: Sit to/from Stand Sit to Stand: Min guard         General transfer comment: Verbal cues for hand placement and safety.  Cues to keep RW with him until safe to sit.  Ambulation/Gait Ambulation/Gait assistance: Supervision Ambulation Distance (Feet): 180 Feet Assistive device: Rolling walker (2 wheeled) Gait Pattern/deviations: Step-to pattern;Decreased stance time - right;Decreased step length - left;Decreased stride length;Antalgic;Decreased weight shift to right Gait velocity: Decreased Gait velocity interpretation: Below normal speed for age/gender General Gait Details: Demonstrates safe use of RW.  Improved distance today.   Stairs Stairs:  (Patient declined - has a 4" step. Feels OK about step)          Wheelchair Mobility    Modified Rankin (Stroke Patients Only)       Balance                                    Cognition Arousal/Alertness: Awake/alert Behavior During Therapy: WFL for tasks assessed/performed Overall Cognitive Status: Within Functional Limits for tasks assessed                      Exercises      General Comments        Pertinent Vitals/Pain Pain  Assessment: 0-10 Pain Score: 5  Pain Location: Rt knee Pain Descriptors / Indicators: Sore Pain Intervention(s): Monitored during session;Repositioned    Home Living                      Prior Function            PT Goals (current goals can now be found in the care plan section) Progress towards PT goals: Goals met/education completed, patient discharged from PT    Frequency  7X/week    PT Plan Current plan remains appropriate    Co-evaluation             End of Session Equipment Utilized During Treatment: Right knee immobilizer Activity Tolerance: Patient tolerated treatment well Patient left: in chair;with call bell/phone within reach     Time: 0905-0915 PT Time Calculation (min) (ACUTE ONLY): 10 min  Charges:  $Gait Training: 8-22 mins                    G Codes:      Despina Pole 09-26-14, 9:33 AM Carita Pian. Sanjuana Kava, [place] Pager [phone]

## 2014-09-21 NOTE — Progress Notes (Signed)
PATIENT ID: Phillip Garrison  MRN: [id number]  DOB/AGE:  11/26/1960 / 54 y.o.  2 Days Post-Op Procedure(s) (LRB): TOTAL KNEE REVISION (Right)    PROGRESS NOTE Subjective: Patient is alert, oriented, non Nausea, no Vomiting, yes passing gas, no Bowel Movement. Taking PO well. Denies SOB, Chest or Calf Pain. Using Incentive Spirometer, PAS in place. Ambulate WBAT with pt passing therapy goals., CPM 0-60 Patient reports pain as mild  .    Objective: Vital signs in last 24 hours: Filed Vitals:   09/20/14 0442 09/20/14 1351 09/20/14 2038 09/21/14 0454  BP: 122/74 127/80 128/73 122/78  Pulse: 73 75 67 59  Temp: 97.8 F (36.6 C) 98.1 F (36.7 C) 97.7 F (36.5 C) 97.6 F (36.4 C)  TempSrc: Oral  Oral Oral  Resp:  18 18 18   Height:      Weight:      SpO2: 92% 91% 91% 91%      Intake/Output from previous day: I/O last 3 completed shifts: In: 565 [P.O.:240; I.V.:275; IV Piggyback:50] Out: 850 [Urine:350; Drains:500]   Intake/Output this shift:     LABORATORY DATA:  Recent Labs  09/20/14 0410 09/21/14 0508  WBC 9.6 15.4*  HGB 11.8* 11.4*  HCT 34.6* 33.6*  PLT 179 179  NA 138  --   K 4.6  --   CL 105  --   CO2 28  --   BUN 13  --   CREATININE 0.93  --   GLUCOSE 163*  --   CALCIUM 8.7*  --     Examination: Neurologically intact Neurovascular intact Sensation intact distally Intact pulses distally Dorsiflexion/Plantar flexion intact Incision: dressing C/D/I No cellulitis present Compartment soft}  Assessment:   2 Days Post-Op Procedure(s) (LRB): TOTAL KNEE REVISION (Right) ADDITIONAL DIAGNOSIS: Expected Acute Blood Loss Anemia,   Plan: PT/OT WBAT, CPM 5/hrs day until ROM 0-90 degrees, then D/C CPM DVT Prophylaxis:  SCDx72hrs, ASA 325 mg BID x 4 weeks DISCHARGE PLAN: Home DISCHARGE NEEDS: HHPT, HHRN, CPM, Walker and 3-in-1 comode seat     Phillip Garrison 09/21/2014, 9:36 AM
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
## 2014-09-21 NOTE — Progress Notes (Addendum)
Orthopedic Tech Progress Note Patient Details:  Phillip HazyWilliam A Garrison 08/20/1960 [id number] On cpm at 5:40 am Patient ID: Phillip HazyWilliam A Garrison, male   DOB: 08/15/1960, 54 y.o.   MRN: [id number]   Jennye MoccasinHughes, Aradia Estey Craig 09/21/2014, 5:41 AM

## 2014-09-22 ENCOUNTER — Encounter (HOSPITAL_COMMUNITY): Payer: Self-pay | Admitting: Orthopedic Surgery

## 2014-09-22 LAB — BODY FLUID CULTURE: CULTURE: NO GROWTH

## 2014-09-22 LAB — TISSUE CULTURE
CULTURE: NO GROWTH
GRAM STAIN: NONE SEEN

## 2014-09-24 LAB — ANAEROBIC CULTURE: Gram Stain: NONE SEEN

## 2016-12-18 IMAGING — CR DG CHEST 2V
2 series · 2 of 2 positions shown · non-contrast
Comparison: 10/12/2012

CLINICAL DATA: Preop for knee replacement revision

EXAM:
CHEST  2 VIEW

[w chest pa]
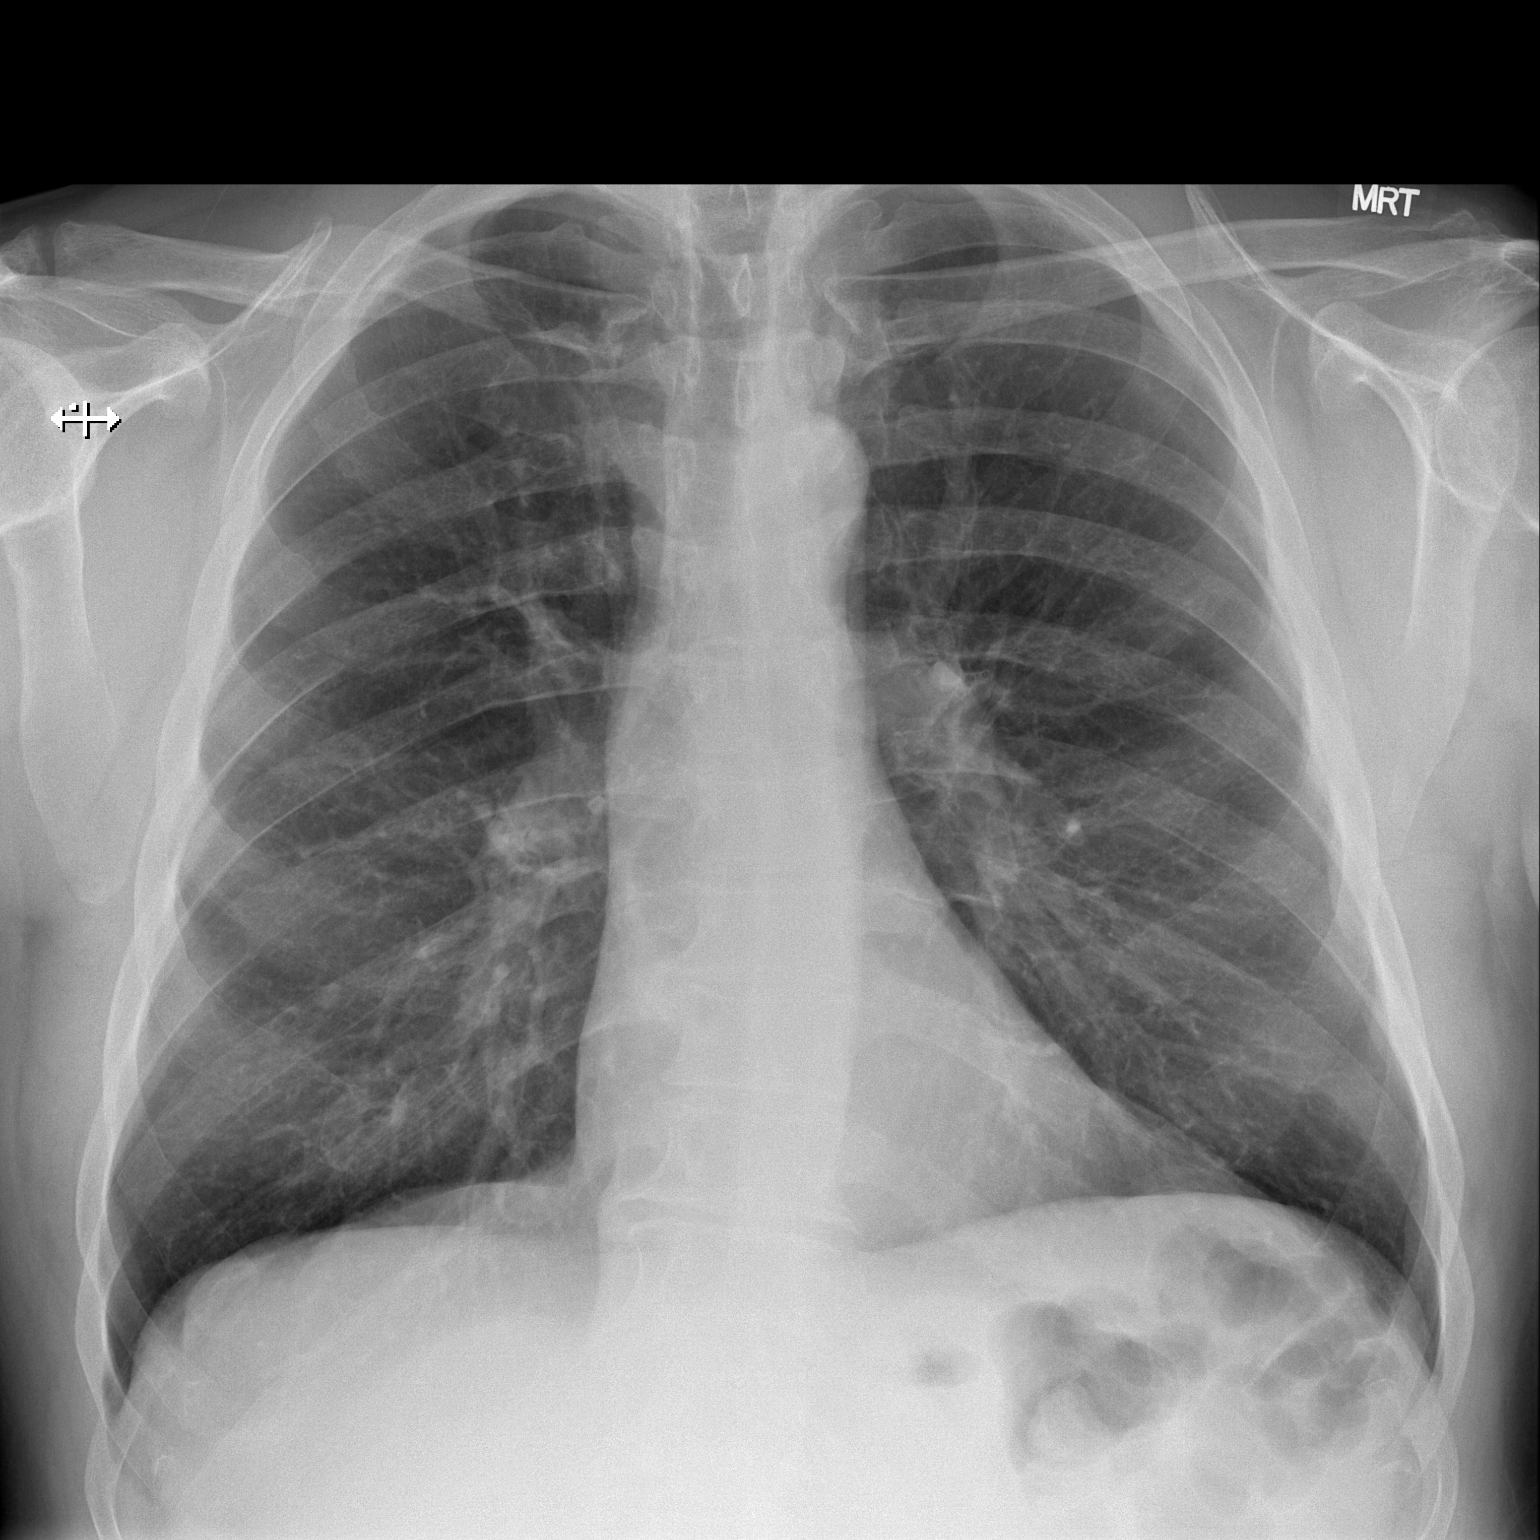

[w chest lat]
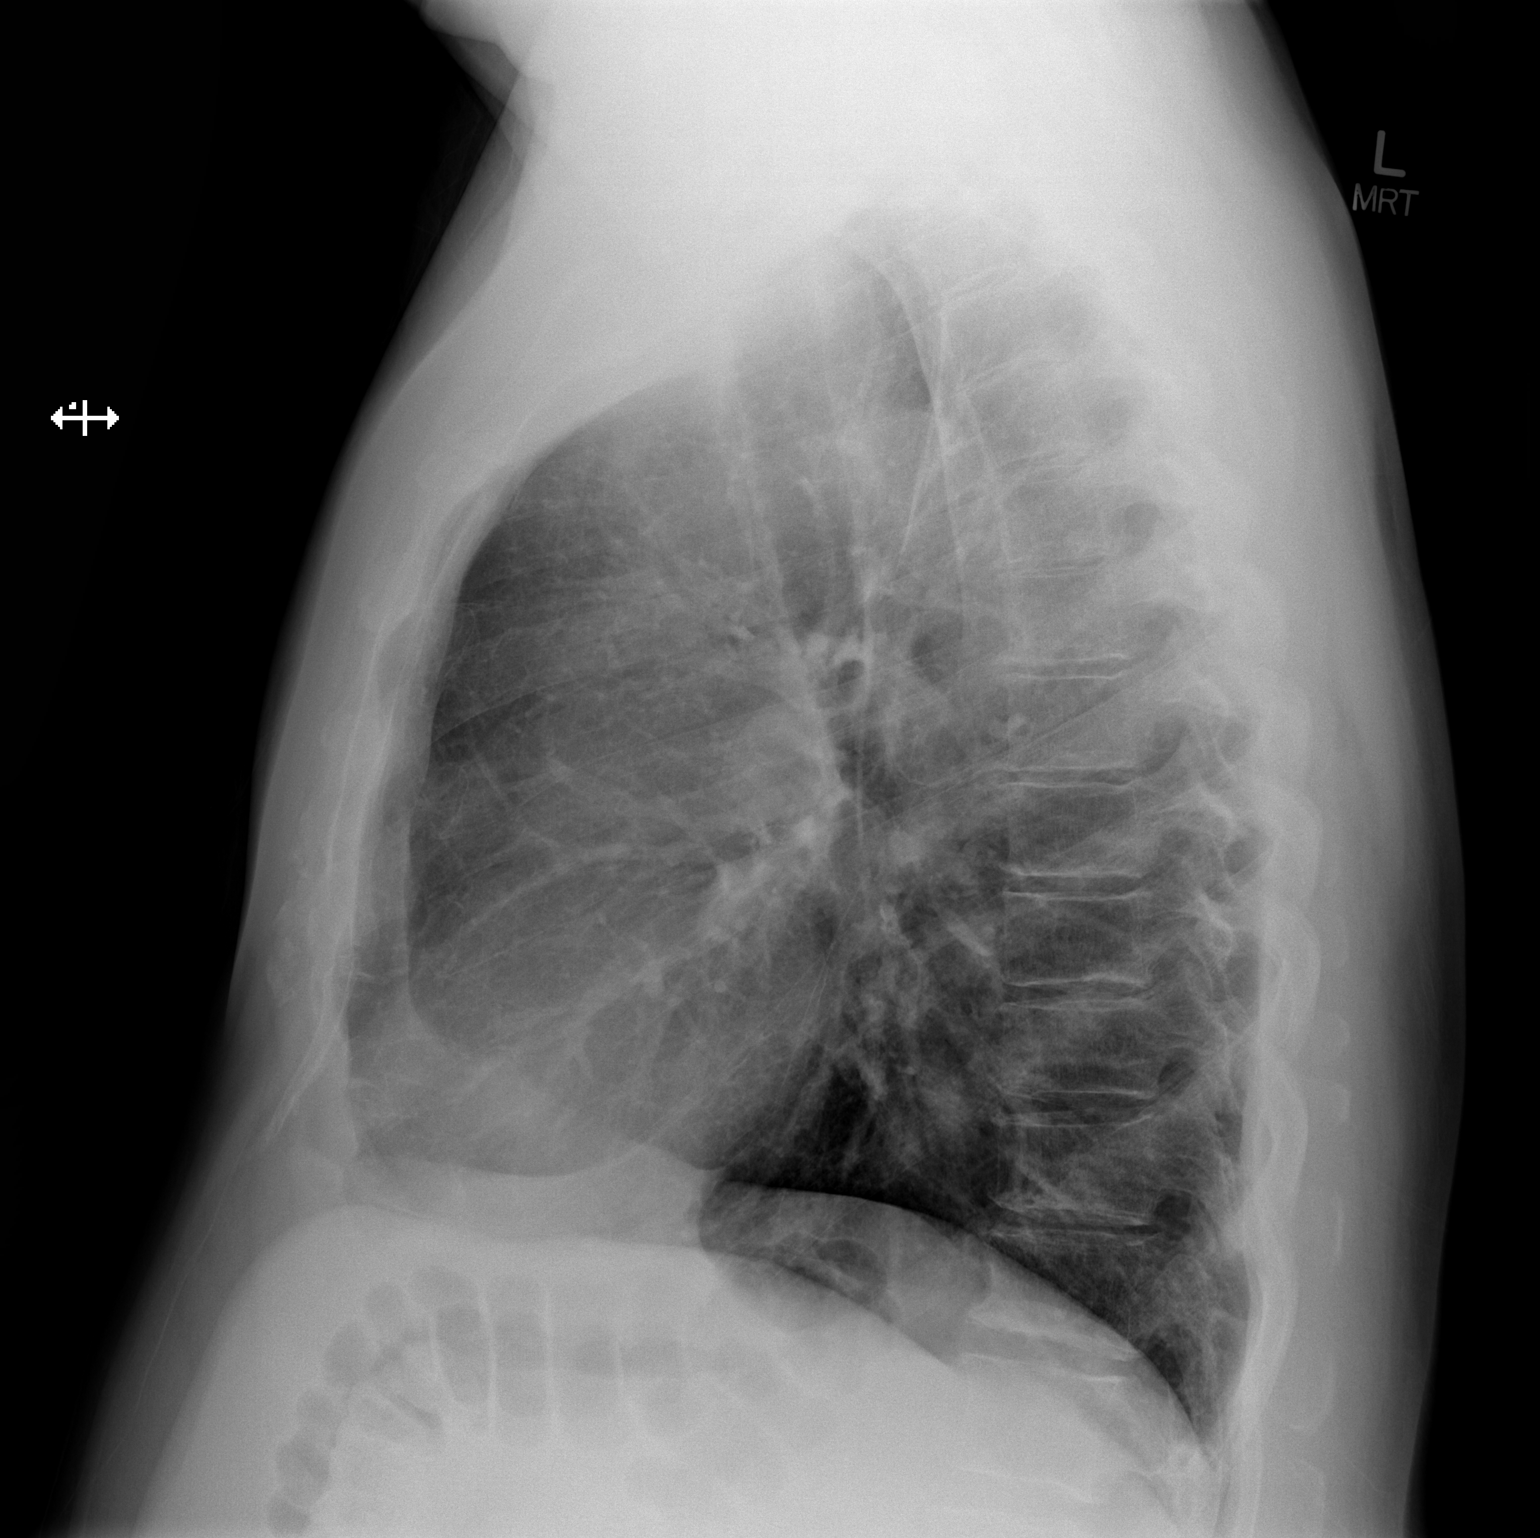

[2 of 2 positions shown; findings below may reference images not displayed]

FINDINGS: Cardiomediastinal silhouette is stable. No acute infiltrate or
pleural effusion. No pulmonary edema. Mild degenerative changes
lower thoracic spine.
IMPRESSION: No active cardiopulmonary disease.
